# Patient Record
Sex: Male | Born: 1965 | Race: White | Hispanic: No | Marital: Married | Smoking: Never smoker
Health system: Southern US, Community
[De-identification: ages and names within clinical notes are randomized; demographics above are authoritative.]

## PROBLEM LIST (undated history)

## (undated) DIAGNOSIS — A048 Other specified bacterial intestinal infections: Secondary | ICD-10-CM

## (undated) DIAGNOSIS — I10 Essential (primary) hypertension: Secondary | ICD-10-CM

## (undated) DIAGNOSIS — M1990 Unspecified osteoarthritis, unspecified site: Secondary | ICD-10-CM

## (undated) DIAGNOSIS — C801 Malignant (primary) neoplasm, unspecified: Secondary | ICD-10-CM

## (undated) DIAGNOSIS — Z85528 Personal history of other malignant neoplasm of kidney: Secondary | ICD-10-CM

## (undated) DIAGNOSIS — M109 Gout, unspecified: Secondary | ICD-10-CM

## (undated) DIAGNOSIS — J029 Acute pharyngitis, unspecified: Secondary | ICD-10-CM

## (undated) DIAGNOSIS — E785 Hyperlipidemia, unspecified: Secondary | ICD-10-CM

## (undated) DIAGNOSIS — N200 Calculus of kidney: Secondary | ICD-10-CM

## (undated) DIAGNOSIS — L409 Psoriasis, unspecified: Secondary | ICD-10-CM

## (undated) HISTORY — DX: Personal history of other malignant neoplasm of kidney: Z85.528

## (undated) HISTORY — DX: Calculus of kidney: N20.0

## (undated) HISTORY — DX: Gout, unspecified: M10.9

## (undated) HISTORY — DX: Hyperlipidemia, unspecified: E78.5

## (undated) HISTORY — DX: Unspecified osteoarthritis, unspecified site: M19.90

## (undated) HISTORY — DX: Other specified bacterial intestinal infections: A04.8

## (undated) HISTORY — DX: Psoriasis, unspecified: L40.9

## (undated) HISTORY — DX: Malignant (primary) neoplasm, unspecified: C80.1

## (undated) HISTORY — PX: COLONOSCOPY: SHX174

## (undated) HISTORY — DX: Essential (primary) hypertension: I10

---

## 2004-10-25 DIAGNOSIS — I10 Essential (primary) hypertension: Secondary | ICD-10-CM

## 2004-10-25 HISTORY — DX: Essential (primary) hypertension: I10

## 2004-11-07 ENCOUNTER — Ambulatory Visit: Payer: Self-pay | Admitting: Family Medicine

## 2004-12-04 ENCOUNTER — Ambulatory Visit: Payer: Self-pay | Admitting: Family Medicine

## 2004-12-11 ENCOUNTER — Ambulatory Visit: Payer: Self-pay | Admitting: Family Medicine

## 2005-01-20 ENCOUNTER — Ambulatory Visit: Payer: Self-pay | Admitting: Family Medicine

## 2005-03-21 ENCOUNTER — Ambulatory Visit: Payer: Self-pay | Admitting: Family Medicine

## 2005-04-02 ENCOUNTER — Ambulatory Visit: Payer: Self-pay | Admitting: Gastroenterology

## 2005-04-18 ENCOUNTER — Ambulatory Visit: Payer: Self-pay | Admitting: Gastroenterology

## 2005-09-24 DIAGNOSIS — E785 Hyperlipidemia, unspecified: Secondary | ICD-10-CM

## 2005-09-24 HISTORY — DX: Hyperlipidemia, unspecified: E78.5

## 2005-10-24 ENCOUNTER — Ambulatory Visit: Payer: Self-pay | Admitting: Family Medicine

## 2005-11-03 ENCOUNTER — Ambulatory Visit: Payer: Self-pay | Admitting: Family Medicine

## 2006-05-12 ENCOUNTER — Encounter: Payer: Self-pay | Admitting: Family Medicine

## 2006-05-12 DIAGNOSIS — I10 Essential (primary) hypertension: Secondary | ICD-10-CM | POA: Insufficient documentation

## 2006-05-12 DIAGNOSIS — L408 Other psoriasis: Secondary | ICD-10-CM | POA: Insufficient documentation

## 2006-11-13 ENCOUNTER — Ambulatory Visit: Payer: Self-pay | Admitting: Family Medicine

## 2007-06-15 ENCOUNTER — Ambulatory Visit: Payer: Self-pay | Admitting: Family Medicine

## 2007-07-29 ENCOUNTER — Ambulatory Visit: Payer: Self-pay | Admitting: Family Medicine

## 2007-09-13 ENCOUNTER — Ambulatory Visit: Payer: Self-pay | Admitting: Family Medicine

## 2007-10-28 ENCOUNTER — Ambulatory Visit: Payer: Self-pay | Admitting: Family Medicine

## 2007-10-28 LAB — CONVERTED CEMR LAB
AST: 20 units/L (ref 0–37)
BUN: 18 mg/dL (ref 6–23)
Bilirubin, Direct: 0.1 mg/dL (ref 0.0–0.3)
CO2: 31 meq/L (ref 19–32)
Cholesterol: 140 mg/dL (ref 0–200)
Creatinine, Ser: 1.2 mg/dL (ref 0.4–1.5)
Direct LDL: 50.1 mg/dL
HDL: 21.2 mg/dL — ABNORMAL LOW (ref >39.0)
Sodium: 142 meq/L (ref 135–145)
Total Bilirubin: 0.9 mg/dL (ref 0.3–1.2)
Total CHOL/HDL Ratio: 6.6
Triglycerides: 533 mg/dL (ref 0–149)

## 2007-11-03 ENCOUNTER — Ambulatory Visit: Payer: Self-pay | Admitting: Family Medicine

## 2007-11-03 DIAGNOSIS — E785 Hyperlipidemia, unspecified: Secondary | ICD-10-CM | POA: Insufficient documentation

## 2008-07-07 ENCOUNTER — Telehealth: Payer: Self-pay | Admitting: Internal Medicine

## 2008-09-20 ENCOUNTER — Telehealth: Payer: Self-pay | Admitting: Family Medicine

## 2009-04-18 ENCOUNTER — Ambulatory Visit: Payer: Self-pay | Admitting: Family Medicine

## 2009-04-18 DIAGNOSIS — H53149 Visual discomfort, unspecified: Secondary | ICD-10-CM | POA: Insufficient documentation

## 2009-04-18 DIAGNOSIS — R0609 Other forms of dyspnea: Secondary | ICD-10-CM | POA: Insufficient documentation

## 2009-04-18 DIAGNOSIS — R0989 Other specified symptoms and signs involving the circulatory and respiratory systems: Secondary | ICD-10-CM

## 2009-05-10 ENCOUNTER — Encounter: Payer: Self-pay | Admitting: Family Medicine

## 2009-05-15 ENCOUNTER — Encounter: Payer: Self-pay | Admitting: Family Medicine

## 2009-05-18 ENCOUNTER — Encounter: Payer: Self-pay | Admitting: Family Medicine

## 2009-05-18 ENCOUNTER — Encounter: Admission: RE | Admit: 2009-05-18 | Discharge: 2009-05-18 | Payer: Self-pay | Admitting: Neurology

## 2009-05-18 HISTORY — PX: OTHER SURGICAL HISTORY: SHX169

## 2009-05-25 ENCOUNTER — Encounter: Admission: RE | Admit: 2009-05-25 | Discharge: 2009-05-25 | Payer: Self-pay | Admitting: Neurology

## 2009-06-15 ENCOUNTER — Encounter: Payer: Self-pay | Admitting: Family Medicine

## 2009-06-18 ENCOUNTER — Encounter: Payer: Self-pay | Admitting: Family Medicine

## 2009-06-18 DIAGNOSIS — G43909 Migraine, unspecified, not intractable, without status migrainosus: Secondary | ICD-10-CM | POA: Insufficient documentation

## 2009-07-24 ENCOUNTER — Encounter: Payer: Self-pay | Admitting: Family Medicine

## 2009-07-30 ENCOUNTER — Ambulatory Visit: Payer: Self-pay | Admitting: Family Medicine

## 2009-07-31 ENCOUNTER — Ambulatory Visit (HOSPITAL_COMMUNITY): Admission: RE | Admit: 2009-07-31 | Discharge: 2009-07-31 | Payer: Self-pay | Admitting: Neurology

## 2009-09-10 ENCOUNTER — Telehealth: Payer: Self-pay | Admitting: Family Medicine

## 2009-09-10 ENCOUNTER — Ambulatory Visit: Payer: Self-pay | Admitting: Family Medicine

## 2009-09-11 LAB — CONVERTED CEMR LAB: ALT: 15 units/L (ref 0–53)

## 2009-10-01 ENCOUNTER — Encounter (INDEPENDENT_AMBULATORY_CARE_PROVIDER_SITE_OTHER): Payer: Self-pay | Admitting: *Deleted

## 2009-10-30 ENCOUNTER — Encounter (INDEPENDENT_AMBULATORY_CARE_PROVIDER_SITE_OTHER): Payer: Self-pay | Admitting: *Deleted

## 2009-10-31 ENCOUNTER — Ambulatory Visit: Payer: Self-pay | Admitting: Family Medicine

## 2009-11-01 LAB — CONVERTED CEMR LAB
ALT: 14 units/L (ref 0–53)
AST: 20 units/L (ref 0–37)
Direct LDL: 83.3 mg/dL
HDL: 28.3 mg/dL — ABNORMAL LOW (ref >39.00)
Total CHOL/HDL Ratio: 6

## 2009-11-02 ENCOUNTER — Ambulatory Visit: Payer: Self-pay | Admitting: Family Medicine

## 2010-03-26 NOTE — Assessment & Plan Note (Signed)
Summary: discuss triglycerides   Vital Signs:  Patient profile:   45 year old male Weight:      204 pounds Temp:     98.1 degrees F oral Pulse rate:   72 / minute Pulse rhythm:   regular BP sitting:   118 / 80  (left arm) Cuff size:   regular  Vitals Entered By: Sydell Axon LPN (July 30, 1608 9:12 AM) CC: Discuss triglyerides   History of Present Illness: Pt here to discuss Triglycerides which are again found to be high. He was found to have only migraines after a scare on his Head MRI/MRA to have lesions concerning for MS...he was reassured he does not currently have MS. Overall he is doing well. His "lesions" could be atherosclerotic and he is agreeable to trmt.  Problems Prior to Update: 1)  Migraine Headache (DR LEWIT)  (ICD-346.90) 2)  Snoring  (ICD-786.09) 3)  Photophobia  (ICD-368.13) 4)  Health Maintenance Exam  (ICD-V70.0) 5)  Psoriasis  (ICD-696.1) 6)  Hypertension  (ICD-401.9) 7)  Hyperlipidemia (LOW HDL/HIGH TRIG)  (ICD-272.4)  Medications Prior to Update: 1)  Lisinopril 20 Mg  Tabs (Lisinopril) .... One Tab By Mouth Daily  Allergies: No Known Drug Allergies  Physical Exam  General:  Well-developed,well-nourished,in no acute distress; alert,appropriate and cooperative throughout examination Head:  Normocephalic and atraumatic without obvious abnormalities. No apparent alopecia or balding. Eyes:  Conjunctiva clear bilaterally.  Ears:  External ear exam shows no significant lesions or deformities.  Otoscopic examination reveals clear canals, tympanic membranes are intact bilaterally without bulging, retraction, inflammation or discharge. Hearing is grossly normal bilaterally. Nose:  External nasal examination shows no deformity or inflammation. Nasal mucosa are pink and moist without lesions or exudates. Mouth:  Oral mucosa and oropharynx without lesions or exudates.  Teeth in good repair.   Impression & Recommendations:  Problem # 1:  HYPERLIPIDEMIA (LOW  HDL/HIGH TRIG) (ICD-272.4) Assessment Deteriorated Start Niaspan and recheck per insructions. His updated medication list for this problem includes:    Niaspan 500 Mg Cr-tabs (Niacin (antihyperlipidemic)) ..... One tab by mouth at night    Niaspan 1000 Mg Cr-tabs (Niacin (antihyperlipidemic)) ..... One tab by mouth at night  Labs Reviewed: SGOT: 20 (10/28/2007)   SGPT: 19 (10/28/2007)   HDL:21.2 (10/28/2007)  LDL:DEL (10/28/2007)  Chol:140 (10/28/2007)  Trig:533 (10/28/2007)  Complete Medication List: 1)  Lisinopril 20 Mg Tabs (Lisinopril) .... One tab by mouth daily 2)  Niaspan 500 Mg Cr-tabs (Niacin (antihyperlipidemic)) .... One tab by mouth at night 3)  Niaspan 1000 Mg Cr-tabs (Niacin (antihyperlipidemic)) .... One tab by mouth at night 4)  Aspir-low 81 Mg Tbec (Aspirin) .... One tab by mouth daily  Patient Instructions: 1)  SGOT, SGPT 272.44    6 WEEKS 2)  See me in 3 mos, SGOT, SGPT, CHOL PROFILE  272.  4   prior  with Dr Para March Prescriptions: NIASPAN 1000 MG CR-TABS (NIACIN (ANTIHYPERLIPIDEMIC)) one tab by mouth at night  #30 x 12   Entered and Authorized by:   Shaune Leeks MD   Signed by:   Shaune Leeks MD on 07/30/2009   Method used:   [facility]        [facility]  [phone]* (retail)       [address] [place], [place]  [address]       Ph: [phone] or [phone]       Fax: [phone]   RxID:  [id number] NIASPAN 500 MG CR-TABS (NIACIN (ANTIHYPERLIPIDEMIC)) one tab by mouth at night  #30 x 0   Entered and Authorized by:   Shaune Leeks MD   Signed by:   Shaune Leeks MD on 07/30/2009   Method used:   [facility]        [facility]  [phone]* (retail)       [address] [place], [place]  [address]       Ph: [phone] or [phone]       Fax: [phone]   RxID:   [id number]   Current Allergies (reviewed today): No known allergies

## 2010-03-26 NOTE — Consult Note (Signed)
Summary: [facility]liot [facility]  [facility]li[facility],Note   Imported By: Beau Fanny 07/30/2009 14:00:04  _____________________________________________________________________  External Attachment:    Type:   Image     Comment:   External Document

## 2010-03-26 NOTE — Assessment & Plan Note (Signed)
Summary: MIGRAINE HA/CLE   Vital Signs:  Patient profile:   45 year old male Height:      73.5 inches Weight:      204 pounds BMI:     26.65 Temp:     98.2 degrees F oral Pulse rate:   56 / minute Pulse rhythm:   regular BP sitting:   130 / 86  (left arm) Cuff size:   regular  Vitals Entered By: Sydell Axon LPN (April 18, 2009 10:42 AM) CC: Migraine like symptoms, light flashes but not much headache and wants to discuss snoring options   History of Present Illness: Pt here for minimal headaches but sensitivity to light and light flashes without much headache. He saw an eye doctor and was told vision off but otherwise nml. He has noticed sensitivity to light and intense light. He then sees light flashes for the next 20 mins or more.  He also has snoring. He does not stop breathing. It is positional.  Problems Prior to Update: 1)  Health Maintenance Exam  (ICD-V70.0) 2)  Psoriasis  (ICD-696.1) 3)  Hypertension  (ICD-401.9) 4)  Hyperlipidemia (LOW HDL/HIGH TRIG)  (ICD-272.4)  Medications Prior to Update: 1)  Lisinopril 20 Mg  Tabs (Lisinopril) .... One Tab By Mouth Qd 2)  Valtrex 1 Gm Tabs (Valacyclovir Hcl) .... 2 Tabs By Mouth Two Times A Day For One Day.  Allergies: No Known Drug Allergies  Physical Exam  General:  Well-developed,well-nourished,in no acute distress; alert,appropriate and cooperative throughout examination Head:  Normocephalic and atraumatic without obvious abnormalities. No apparent alopecia or balding. Eyes:  Conjunctiva clear bilaterally.  Ears:  External ear exam shows no significant lesions or deformities.  Otoscopic examination reveals clear canals, tympanic membranes are intact bilaterally without bulging, retraction, inflammation or discharge. Hearing is grossly normal bilaterally. Nose:  External nasal examination shows no deformity or inflammation. Nasal mucosa are pink and moist without lesions or exudates. Mouth:  Oral mucosa and oropharynx  without lesions or exudates.  Teeth in good repair. Neurologic:  No cranial nerve deficits noted. Station and gait are normal.  Sensory, motor and coordinative functions appear intact.   Impression & Recommendations:  Problem # 1:  PHOTOPHOBIA (ICD-368.13) Assessment New Refer for migraine eval. Orders: Neurology Referral (Neuro)  Problem # 2:  SNORING (ICD-786.09) Assessment: New  Discussed poss use of new appliance seen on the market. His updated medication list for this problem includes:    Lisinopril 20 Mg Tabs (Lisinopril) ..... One tab by mouth daily  Complete Medication List: 1)  Lisinopril 20 Mg Tabs (Lisinopril) .... One tab by mouth daily  Patient Instructions: 1)  Refer for possible migraine eval.  Current Allergies (reviewed today): No known allergies

## 2010-03-26 NOTE — Progress Notes (Signed)
Summary: Rx request for Lisinopril  Phone Note Refill Request Call back at [phone] Message from:  Patient on September 10, 2009 12:33 PM  Refills Requested: Medication #1:  LISINOPRIL 20 MG  TABS one tab by mouth daily [facility] [place] [facility] [address]..Call back # [phone]   Method Requested: Electronic Initial call taken by: Daine Gip,  September 10, 2009 12:33 PM  Follow-up for Phone Call        refill sent electronically. Follow-up by: Delilah Shan CMA Duncan Dull),  September 10, 2009 2:34 PM    Prescriptions: LISINOPRIL 20 MG  TABS (LISINOPRIL) one tab by mouth daily  #30 x 12   Entered by:   Delilah Shan CMA (AAMA)   Authorized by:   Crawford Givens MD   Signed by:   Delilah Shan CMA (AAMA) on 09/10/2009   Method used:   [facility]        [facility]  [phone]* (retail)       [address] [place], [place]  [address]       Ph: [phone] or [phone]       Fax: [phone]   RxID:   [id number]

## 2010-03-26 NOTE — Assessment & Plan Note (Signed)
Summary: F/U AFTER LABS / LFW r/s 11/01/09   Vital Signs:  Patient profile:   45 year old male Height:      73.5 inches Weight:      202.75 pounds BMI:     26.48 Temp:     98 degrees F oral Pulse rate:   64 / minute Pulse rhythm:   regular BP sitting:   124 / 80  (left arm) Cuff size:   large  Vitals Entered By: Delilah Shan CMA Vivica Dobosz Dull) (November 02, 2009 11:50 AM) CC: 3 months follow up after labs   History of Present Illness: Hypertension:      Using medication without problems or lightheadedness: yes Chest pain with exertion:no Edema:no Short of breath:no Average home BPs: similar to today Other issues: no  Elevated Cholesterol: Using medications without problems:yes Muscle aches: no Other complaints: no flushing  Problems Prior to Update: 1)  Migraine Headache (DR LEWIT)  (ICD-346.90) 2)  Snoring  (ICD-786.09) 3)  Photophobia  (ICD-368.13) 4)  Health Maintenance Exam  (ICD-V70.0) 5)  Psoriasis  (ICD-696.1) 6)  Hypertension  (ICD-401.9) 7)  Hyperlipidemia (LOW HDL/HIGH TRIG)  (ICD-272.4)  Allergies: No Known Drug Allergies  Family History: Reviewed history from 11/03/2007 and no changes required. Father: Alive MI/ HTN Mother: ALIVE, HLD Siblings: 1 sister DECEASED 23/HYDROCEPHALOUS-SHUNT AS CHILD  CV +FATHER MI,PGF PGF DECEASED MI HBP+SELF DM NEGATIVE ETOH /ABUSE UNCLE  Social History: Reviewed history from 05/12/2006 and no changes required. 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  Otherwise negative.    Physical Exam  General:  GEN: nad, alert and oriented HEENT: mucous membranes moist NECK: supple w/o LA CV: rrr.  no murmur PULM: ctab, no inc wob ABD: soft, +bs EXT: no edema SKIN: no acute rash    Impression & Recommendations:  Problem # 1:   HYPERTENSION (ICD-401.9) >25 min spent with patient, at least half of which was spent on counseling re: HTN and HLD.  No change in meds.   labs reviewed with patient.   His updated medication list for this problem includes:    Lisinopril 20 Mg Tabs (Lisinopril) ..... One tab by mouth daily  Problem # 2:  HYPERLIPIDEMIA (LOW HDL/HIGH TRIG) (ICD-272.4) No change in meds other than adding fish oil.  Tolerating well.  D/w patient [id number].  The following medications were removed from the medication list:    Niaspan 500 Mg Cr-tabs (Niacin (antihyperlipidemic)) ..... One tab by mouth at night His updated medication list for this problem includes:    Niaspan 1000 Mg Cr-tabs (Niacin (antihyperlipidemic)) ..... One tab by mouth at night  Complete Medication List: 1)  Lisinopril 20 Mg Tabs (Lisinopril) .... One tab by mouth daily 2)  Niaspan 1000 Mg Cr-tabs (Niacin (antihyperlipidemic)) .... One tab by mouth at night 3)  Aspir-low 81 Mg Tbec (Aspirin) .... One tab by mouth daily  Patient Instructions: 1)  See if you can get a flu shot at work.  Try to adjust your diet and increase your amount of exercise.  I would take fish oil at night (1-2 tabs). 2)  I would get a physical next year.  Prescriptions: NIASPAN 1000 MG CR-TABS (NIACIN (ANTIHYPERLIPIDEMIC)) one tab by mouth at night  #90 x 3   Entered and Authorized by:   Crawford Givens  MD   Signed by:   Crawford Givens MD on 11/02/2009   Method used:   [facility]  [phone]* (retail)       [address] [place], [place]  [address]       Ph: [phone] or [phone]       Fax: [phone]   RxID:   [id number] LISINOPRIL 20 MG  TABS (LISINOPRIL) one tab by mouth daily  #90 x 3   Entered and Authorized by:   Crawford Givens MD   Signed by:   Crawford Givens MD on 11/02/2009   Method used:   [facility]        [facility]  [phone]* (retail)       [address] [place],  [place]  [address]       Ph: [phone] or [phone]       Fax: [phone]   RxID:   [id number]   Current Allergies (reviewed today): No known allergies

## 2010-03-26 NOTE — Letter (Signed)
Summary: [facility] / BRAIN MRI STUDY / DR. Woody Seller LEWIT   [facility] BRAIN MRI STUDY / DR. Woody Seller LEWIT   Imported By: Carin Primrose 06/21/2009 10:33:36  _____________________________________________________________________  External Attachment:    Type:   Image     Comment:   External Document  Appended Document: [facility] / BRAIN MRI STUDY / DR. Woody Seller LEWIT      Clinical Lists Changes  Problems: Added new problem of MIGRAINE HEADACHE (DR LEWIT) (ICD-346.90) Observations: Added new observation of PAST SURG HX: colonoscopy/polyp 1991 colonoscopy diverticulitis/ 04/18/2005 MRI Head Multip Nonspecific changes.     05/18/2009 MRA Head nad Neck  Nml  05/18/2009  (06/21/2009 17:53)       Past Surgical History:    colonoscopy/polyp 1991    colonoscopy diverticulitis/ 04/18/2005    MRI Head Multip Nonspecific changes.     05/18/2009    MRA Head nad Neck  Nml  05/18/2009

## 2010-03-26 NOTE — Letter (Signed)
Summary: [place] No Show Letter  [place] [facility]  [address] [place], [place] [address]   Phone: [phone]  Fax: [phone]    10/30/2009 MRN: [id number]  [facility] [address] [place], [place]  [address]   Dear Mr. Ruble,   Our records indicate that you missed your scheduled appointment with ____lab_________________ on _9.6.11___________.  Please contact this office to reschedule your appointment as soon as possible.  It is important that you keep your scheduled appointments with your physician, so we can provide you the best care possible.  Please be advised that there may be a charge for "no show" appointments.    Sincerely,   [place] [facility]

## 2010-03-26 NOTE — Letter (Signed)
Summary: Nadara Eaton letter  [place] [facility]  [address] [place], [place] [address]   Phone: [phone]  Fax: [phone]       10/01/2009 MRN: [id number]  [facility] [address] [place], [place]  [address]  Dear Mr. Ladell Heads [facility] [place] and [place] announce the retirement of Arta Silence, M.D., from full-time practice at the [facility] office effective August 23, 2009 and his plans of returning part-time.  It is important to Dr. Hetty Ely and to our practice that you understand that [facility] has seven physicians in our office for your health care needs.  We will continue to offer the same exceptional care that you have today.    Dr. Hetty Ely has spoken to many of you about his plans for retirement and returning part-time in the fall.   We will continue to work with you through the transition to schedule appointments for you in the office and meet the high standards that [facility] is committed to.   Again, it is with great pleasure that we share the news that Dr. Hetty Ely will return to [facility] in October of 2011 with a reduced schedule.    If you have any questions, or would like to request an appointment with one of our physicians, please call us at [phone] and press the option for Scheduling an appointment.  We take pleasure in providing you with excellent patient care and look forward to seeing you at your next office visit.  Our [facility] Physicians are:  Tillman Abide, M.D. Laurita Quint, M.D. Roxy Manns, M.D. Kerby Nora, M.D. Hannah Beat, M.D. Ruthe Mannan, M.D. We proudly welcomed Raechel Ache, M.D. and Eustaquio Boyden, M.D. to the practice in July/August 2011.  Sincerely,  [place] [facility]

## 2010-03-26 NOTE — Consult Note (Signed)
Summary: [facility]Eliot [facility]it,[facility],Note  Dr[facility]Eliot [facility],Note   Imported By: Beau Fanny 05/11/2009 16:31:58  _____________________________________________________________________  External Attachment:    Type:   Image     Comment:   External Document

## 2010-11-21 ENCOUNTER — Other Ambulatory Visit: Payer: Self-pay | Admitting: Family Medicine

## 2010-11-21 DIAGNOSIS — I10 Essential (primary) hypertension: Secondary | ICD-10-CM

## 2010-11-22 NOTE — Telephone Encounter (Signed)
Last OV 11/02/2009, please advise.

## 2010-11-24 NOTE — Telephone Encounter (Signed)
Schedule for CPE with labs ahead of time. Rx sent.  Thanks.

## 2010-11-25 NOTE — Telephone Encounter (Signed)
LMOVM at work number to schedule lab and CPE.

## 2011-04-09 ENCOUNTER — Other Ambulatory Visit: Payer: Self-pay | Admitting: *Deleted

## 2011-04-09 MED ORDER — LISINOPRIL 20 MG PO TABS: 20.0000 mg | ORAL_TABLET | Freq: Every day | ORAL | Status: DC

## 2011-04-09 NOTE — Telephone Encounter (Signed)
Patient called requesting a refill on his Lisinopril. Patient has not been seen in over a year. Patient has schedule an appointment in April. Is it okay to give patient enough medication to last until that appointment?

## 2011-04-09 NOTE — Telephone Encounter (Signed)
Patient notified that prescription has been sent to the pharmacy.

## 2011-04-09 NOTE — Telephone Encounter (Signed)
Yes, rx sent.

## 2011-05-19 ENCOUNTER — Other Ambulatory Visit: Payer: Self-pay | Admitting: Family Medicine

## 2011-05-20 NOTE — Telephone Encounter (Signed)
Patient has not been seen since 11/02/2009.

## 2011-05-20 NOTE — Telephone Encounter (Signed)
Sent.  Has 4/13 OV.

## 2011-06-02 ENCOUNTER — Other Ambulatory Visit (INDEPENDENT_AMBULATORY_CARE_PROVIDER_SITE_OTHER): Payer: Self-pay

## 2011-06-02 DIAGNOSIS — I10 Essential (primary) hypertension: Secondary | ICD-10-CM

## 2011-06-02 LAB — LIPID PANEL
HDL: 28.3 mg/dL — ABNORMAL LOW (ref >39.00)
Total CHOL/HDL Ratio: 5

## 2011-06-02 LAB — COMPREHENSIVE METABOLIC PANEL
AST: 21 U/L (ref 0–37)
BUN: 19 mg/dL (ref 6–23)
Calcium: 9.4 mg/dL (ref 8.4–10.5)
Chloride: 104 mEq/L (ref 96–112)
Creatinine, Ser: 1.2 mg/dL (ref 0.4–1.5)
GFR: 67.75 mL/min (ref >60.00)
Potassium: 4.1 mEq/L (ref 3.5–5.1)
Total Bilirubin: 0.5 mg/dL (ref 0.3–1.2)

## 2011-06-09 ENCOUNTER — Encounter: Payer: Self-pay | Admitting: Family Medicine

## 2011-06-10 ENCOUNTER — Encounter: Payer: Self-pay | Admitting: Family Medicine

## 2011-06-13 ENCOUNTER — Encounter: Payer: Self-pay | Admitting: Family Medicine

## 2011-06-13 ENCOUNTER — Ambulatory Visit (INDEPENDENT_AMBULATORY_CARE_PROVIDER_SITE_OTHER): Payer: Commercial Indemnity | Admitting: Family Medicine

## 2011-06-13 VITALS — BP 122/82 | HR 72 | Temp 98.3°F | Wt 197.0 lb

## 2011-06-13 DIAGNOSIS — E785 Hyperlipidemia, unspecified: Secondary | ICD-10-CM

## 2011-06-13 DIAGNOSIS — Z0000 Encounter for general adult medical examination without abnormal findings: Secondary | ICD-10-CM

## 2011-06-13 DIAGNOSIS — I10 Essential (primary) hypertension: Secondary | ICD-10-CM

## 2011-06-13 MED ORDER — NIACIN ER (ANTIHYPERLIPIDEMIC) 1000 MG PO TBCR: 1000.0000 mg | EXTENDED_RELEASE_TABLET | Freq: Every day | ORAL | Status: DC

## 2011-06-13 MED ORDER — LISINOPRIL 20 MG PO TABS: 20.0000 mg | ORAL_TABLET | Freq: Every day | ORAL | Status: DC

## 2011-06-13 NOTE — Progress Notes (Signed)
CPE- see plan.  No indication for PSA or colon cancer screening now.  D/w pt about flu shot. Up to date on td.  D/w pt about diet and exercise.   Hypertension:   Using medication without problems or lightheadedness: yes Chest pain with exertion:no Edema:no Short of breath:no  Elevated Cholesterol: Using medications without problems:yes, still with some occ flushing Muscle aches: no Diet compliance:yes Exercise:limited  Occ brief B testicle sensitivity (slightly more than he'd expect) with mild pressure, but no masses noted.  No dysuria.  Testicles usually not ttp.  Meds, vitals, and allergies reviewed.   PMH and SH reviewed  ROS: See HPI.  Otherwise negative.    GEN: nad, alert and oriented HEENT: mucous membranes moist NECK: supple w/o LA CV: rrr. PULM: ctab, no inc wob ABD: soft, +bs EXT: no edema SKIN: no acute rash Testes bilaterally descended without nodularity, tenderness or masses. No scrotal masses or lesions. No penis lesions or urethral discharge.

## 2011-06-13 NOTE — Patient Instructions (Signed)
Take care.  I would get a flu shot each fall.   Don't change your meds.  Increase your amount of exercise gradually.

## 2011-06-16 DIAGNOSIS — Z0000 Encounter for general adult medical examination without abnormal findings: Secondary | ICD-10-CM | POA: Insufficient documentation

## 2011-06-16 NOTE — Assessment & Plan Note (Addendum)
No indication for PSA or colon cancer screening now.  D/w pt about flu shot. Up to date on td.  D/w pt about diet and exercise.  Normal testicular exam, he'll notify me of changes.

## 2011-06-16 NOTE — Assessment & Plan Note (Signed)
Controlled. Continue current meds.  D/w pt about labs.

## 2011-06-16 NOTE — Assessment & Plan Note (Signed)
Not controlled. Continue current meds.  D/w pt about labs. Needs to work on exercise to try to optimize his numbers.

## 2012-02-01 ENCOUNTER — Emergency Department (HOSPITAL_COMMUNITY)
Admission: EM | Admit: 2012-02-01 | Discharge: 2012-02-01 | Disposition: A | Discharge status: Home / Self Care | Payer: Commercial Indemnity | Attending: Emergency Medicine | Admitting: Emergency Medicine

## 2012-02-01 ENCOUNTER — Emergency Department (HOSPITAL_COMMUNITY): Payer: Commercial Indemnity

## 2012-02-01 DIAGNOSIS — R112 Nausea with vomiting, unspecified: Secondary | ICD-10-CM | POA: Insufficient documentation

## 2012-02-01 DIAGNOSIS — Z862 Personal history of diseases of the blood and blood-forming organs and certain disorders involving the immune mechanism: Secondary | ICD-10-CM | POA: Insufficient documentation

## 2012-02-01 DIAGNOSIS — R7309 Other abnormal glucose: Secondary | ICD-10-CM | POA: Insufficient documentation

## 2012-02-01 DIAGNOSIS — Z8619 Personal history of other infectious and parasitic diseases: Secondary | ICD-10-CM | POA: Insufficient documentation

## 2012-02-01 DIAGNOSIS — R7303 Prediabetes: Secondary | ICD-10-CM

## 2012-02-01 DIAGNOSIS — I10 Essential (primary) hypertension: Secondary | ICD-10-CM | POA: Insufficient documentation

## 2012-02-01 DIAGNOSIS — Z79899 Other long term (current) drug therapy: Secondary | ICD-10-CM | POA: Insufficient documentation

## 2012-02-01 DIAGNOSIS — R1032 Left lower quadrant pain: Secondary | ICD-10-CM | POA: Insufficient documentation

## 2012-02-01 DIAGNOSIS — Z8679 Personal history of other diseases of the circulatory system: Secondary | ICD-10-CM | POA: Insufficient documentation

## 2012-02-01 DIAGNOSIS — Z7982 Long term (current) use of aspirin: Secondary | ICD-10-CM | POA: Insufficient documentation

## 2012-02-01 DIAGNOSIS — N201 Calculus of ureter: Secondary | ICD-10-CM

## 2012-02-01 LAB — URINALYSIS, ROUTINE W REFLEX MICROSCOPIC
Leukocytes, UA: NEGATIVE
Nitrite: NEGATIVE
Protein, ur: NEGATIVE mg/dL
Specific Gravity, Urine: 1.024 (ref 1.005–1.030)
pH: 6.5 (ref 5.0–8.0)

## 2012-02-01 LAB — COMPREHENSIVE METABOLIC PANEL
ALT: 12 U/L (ref 0–53)
Alkaline Phosphatase: 96 U/L (ref 39–117)
BUN: 25 mg/dL — ABNORMAL HIGH (ref 6–23)
CO2: 28 mEq/L (ref 19–32)
Chloride: 101 mEq/L (ref 96–112)
GFR calc Af Amer: 77 mL/min — ABNORMAL LOW (ref >90)
GFR calc non Af Amer: 66 mL/min — ABNORMAL LOW (ref >90)
Potassium: 4.2 mEq/L (ref 3.5–5.1)
Total Protein: 7.7 g/dL (ref 6.0–8.3)

## 2012-02-01 LAB — CBC WITH DIFFERENTIAL/PLATELET
Basophils Absolute: 0.1 10*3/uL (ref 0.0–0.1)
Eosinophils Relative: 1 % (ref 0–5)
Hemoglobin: 16.4 g/dL (ref 13.0–17.0)
Lymphocytes Relative: 17 % (ref 12–46)
MCH: 32.4 pg (ref 26.0–34.0)
Monocytes Absolute: 1.2 10*3/uL — ABNORMAL HIGH (ref 0.1–1.0)
Monocytes Relative: 8 % (ref 3–12)
Neutro Abs: 11 10*3/uL — ABNORMAL HIGH (ref 1.7–7.7)
Platelets: 231 10*3/uL (ref 150–400)
RBC: 5.06 MIL/uL (ref 4.22–5.81)
WBC: 14.9 10*3/uL — ABNORMAL HIGH (ref 4.0–10.5)

## 2012-02-01 LAB — URINE MICROSCOPIC-ADD ON

## 2012-02-01 MED ORDER — ONDANSETRON HCL 4 MG PO TABS: 4.0000 mg | ORAL_TABLET | Freq: Four times a day (QID) | ORAL | Status: DC

## 2012-02-01 MED ORDER — ONDANSETRON HCL 4 MG/2ML IJ SOLN
4.0000 mg | Freq: Once | INTRAMUSCULAR | Status: AC
  Administered 2012-02-01: 4 mg via INTRAVENOUS
  Filled 2012-02-01: qty 2

## 2012-02-01 MED ORDER — HYDROMORPHONE HCL PF 1 MG/ML IJ SOLN
0.5000 mg | Freq: Once | INTRAMUSCULAR | Status: AC
  Administered 2012-02-01: 0.5 mg via INTRAVENOUS
  Filled 2012-02-01: qty 1

## 2012-02-01 MED ORDER — HYDROMORPHONE HCL PF 1 MG/ML IJ SOLN
0.5000 mg | Freq: Once | INTRAMUSCULAR | Status: AC
  Administered 2012-02-01: 0.5 mg via INTRAVENOUS
  Filled 2012-02-01 (×2): qty 1

## 2012-02-01 MED ORDER — OXYCODONE-ACETAMINOPHEN 5-325 MG PO TABS: 1.0000 | ORAL_TABLET | Freq: Four times a day (QID) | ORAL | Status: DC | PRN

## 2012-02-01 NOTE — ED Provider Notes (Signed)
History     CSN: [id number]  Arrival date & time 02/01/12  0865   First MD Initiated Contact with Patient 02/01/12 0750      Chief Complaint  Patient presents with  . Flank Pain    (Consider location/radiation/quality/duration/timing/severity/associated sxs/prior treatment) HPI Patient presents to the emergency department with acute onset at 3:30 this morning of left flank pain that radiates around towards his left lower quadrant. He says that it woke him up from his sleep. Is not having any fevers, chills, nausea, vomiting, diarrhea. He has a past medical history which is positive for diverticulosis. Has never had kidney stones in the past. Denies radiation towards his testicles. He denies any hematuria or dysuria. nad vss   Past Medical History  Diagnosis Date  . Hyperlipemia 08/07  . Hypertension 09/06  . Psoriasis   . Migraine     Past Surgical History  Procedure Date  . Mri head 05/18/09    Multip nonspecific changes  . Mra head and neck 05/18/09    Nml    Family History  Problem Relation Age of Onset  . Hyperlipidemia Mother   . Heart disease Father     MI  . Hypertension Father   . Heart disease Paternal Grandfather     MI  . Diabetes Neg Hx   . Prostate cancer Neg Hx   . Alcohol abuse Other     History  Substance Use Topics  . Smoking status: Never Smoker   . Smokeless tobacco: Not on file  . Alcohol Use: Yes     Comment: < 5 week, sometimes none      Review of Systems  Review of Systems  Gen: no weight loss, fevers, chills, night sweats  Neck: no neck pain  Lungs:No wheezing, coughing or hemoptysis CV: no chest pain, palpitations, dependent edema or orthopnea  Abd: + flank pain, nausea, vomiting  GU: no dysuria or gross hematuria  MSK:  No abnormalities  Neuro: no headache, no focal neurologic deficits  Skin: no abnormalities Psyche: negative.   Allergies  Review of patient's allergies indicates no known allergies.  Home Medications    Current Outpatient Rx  Name  Route  Sig  Dispense  Refill  . ASPIRIN 81 MG PO TABS   Oral   Take 81 mg by mouth daily.         Marland Kitchen LISINOPRIL 20 MG PO TABS   Oral   Take 1 tablet (20 mg total) by mouth daily.   90 tablet   3   . ONE-DAILY MULTI VITAMINS PO TABS   Oral   Take 1 tablet by mouth daily.         Marland Kitchen NIACIN ER (ANTIHYPERLIPIDEMIC) 1000 MG PO TBCR   Oral   Take 1 tablet (1,000 mg total) by mouth at bedtime.   90 tablet   3   . ONDANSETRON HCL 4 MG PO TABS   Oral   Take 1 tablet (4 mg total) by mouth every 6 (six) hours.   12 tablet   0   . OXYCODONE-ACETAMINOPHEN 5-325 MG PO TABS   Oral   Take 1 tablet by mouth every 6 (six) hours as needed for pain.   15 tablet   0     BP 136/82  Pulse 64  Temp 97.5 F (36.4 C) (Oral)  Resp 16  SpO2 100%  Physical Exam  Nursing note and vitals reviewed. Constitutional: He appears well-developed and well-nourished. No distress.  Pt unable to sit still because of pain  HENT:  Head: Normocephalic and atraumatic.  Eyes: Pupils are equal, round, and reactive to light.  Neck: Normal range of motion. Neck supple.  Cardiovascular: Normal rate and regular rhythm.   Pulmonary/Chest: Effort normal.  Abdominal: He exhibits no distension and no mass. There is tenderness in the left upper quadrant and left lower quadrant. There is CVA tenderness. There is no rigidity, no rebound, no guarding, no tenderness at McBurney's point and negative Murphy's sign.  Neurological: He is alert.  Skin: Skin is warm and dry.    ED Course  Procedures (including critical care time)  Labs Reviewed  URINALYSIS, ROUTINE W REFLEX MICROSCOPIC - Abnormal; Notable for the following:    Hgb urine dipstick MODERATE (*)     All other components within normal limits  COMPREHENSIVE METABOLIC PANEL - Abnormal; Notable for the following:    Glucose, Bld 154 (*)     BUN 25 (*)     Total Bilirubin 0.2 (*)     GFR calc non Af Amer 66 (*)      GFR calc Af Amer 77 (*)     All other components within normal limits  CBC WITH DIFFERENTIAL - Abnormal; Notable for the following:    WBC 14.9 (*)     Neutro Abs 11.0 (*)     Monocytes Absolute 1.2 (*)     All other components within normal limits  URINE MICROSCOPIC-ADD ON  HEMOGLOBIN A1C   Ct Abdomen Pelvis Wo Contrast  02/01/2012  *RADIOLOGY REPORT*  Clinical Data: Left flank pain, nausea  CT ABDOMEN AND PELVIS WITHOUT CONTRAST  Technique:  Multidetector CT imaging of the abdomen and pelvis was performed following the standard protocol without intravenous contrast.  Comparison: None.  Findings: Lung bases are unremarkable.  Sagittal images of the spine shows degenerative changes with anterior spurring upper endplate of L3, L4 and L5 vertebral body.  Schmorl's node deformity noted upper endplate of L3.  There is disc space flattening with vacuum disc phenomenon at L5 S1 level.  The unenhanced liver shows no biliary ductal dilatation.  No calcified gallstones are noted within gallbladder.  Unenhanced spleen, pancreas and adrenal glands are unremarkable.  There is mild left hydronephrosis and proximal left hydroureter.  Mild left perinephric stranding.  There is a probable cyst in the mid pole of the left kidney measures 1.8 cm.  A calcified obstructive calculus is noted in proximal left ureter at the level of the upper endplate of the L3 vertebral body measures 3 mm.  Nonobstructive calcified calculus in mid pole of the right kidney measures 2 mm.  In axial image 39 and coronal image 51 there is a high density cortical exophytic lesion in the mid pole anterior aspect of the right kidney measures 1 cm.  This cannot be characterized without IV contrast.  Further evaluation with non emergent enhanced CT or MRI is recommended.  No aortic aneurysm.  No small bowel obstruction.  Bilateral distal ureter is unremarkable.  Prostate gland and seminal vesicles are unremarkable.  The urinary bladder is unremarkable.   No calcified calculi are noted within urinary bladder.  There is no pericecal inflammation.  The terminal ileum is unremarkable.  Normal appendix is partially visualized in coronal image 47.  IMPRESSION:  1.  There is mild left hydronephrosis and proximal left hydroureter. 2.  Mild left perinephric stranding.  There is 3 mm calcified obstructive calculus in proximal left ureter at the level of  the upper endplate of the L3 vertebral body. 3.  There is  right nonobstructive nephrolithiasis.In axial image 39 and coronal image 51 there is a high density cortical exophytic lesion in the mid pole anterior aspect of the right kidney measures 1 cm.  This cannot be characterized without IV contrast.  Further evaluation with non emergent enhanced CT or MRI is recommended. 4.  Unremarkable urinary bladder.  Unremarkable distal ureter bilaterally.   Original Report Authenticated By: Natasha Mead, M.D.      1. Prediabetes   2. Ureteral stone       MDM  Pt given 1 mg IV Dilaudid and 4mg  IV Zofran. He had blood in his urine therefore CT abd/pelv wo contrast ordered. It shows 3mm left proximal ureter stone. His cbg also 156. He also has a cyst that is 1.8 on his right kidney. He will follow-up with his PCP Dr. Para March regarding these issues. The stone is small and should not have any difficulty passing stone. Urine strainer given.  Zofran and Perc Rx given  Pt has been advised of the symptoms that warrant their return to the ED. Patient has voiced understanding and has agreed to follow-up with the PCP or specialist.         Dorthula Matas, PA 02/01/12 1129

## 2012-02-01 NOTE — ED Notes (Signed)
Pt states that the pain is now tolerable.  No distress noted.  Pt appears more comfortable.  UA sent.

## 2012-02-01 NOTE — ED Notes (Signed)
Patient transported to CT 

## 2012-02-01 NOTE — ED Notes (Signed)
Pt states that at 0200 this am he began to have severe L flank pain that radiates down to his LLQ.  He states that the pain did wake him from his sleep.  He does c/o of nausea.  No distress noted.  Denies hx of kidney stones.

## 2012-02-01 NOTE — ED Notes (Signed)
Pt given urine strainer for home use.  Pt voices understanding.

## 2012-02-01 NOTE — ED Notes (Signed)
Pt refused 2nd dose of Dilaudid.  Pt states that he is comfortable.

## 2012-02-02 ENCOUNTER — Ambulatory Visit (INDEPENDENT_AMBULATORY_CARE_PROVIDER_SITE_OTHER): Payer: Commercial Indemnity | Admitting: Family Medicine

## 2012-02-02 ENCOUNTER — Encounter: Payer: Self-pay | Admitting: Family Medicine

## 2012-02-02 VITALS — BP 122/82 | HR 93 | Temp 98.5°F | Wt 202.0 lb

## 2012-02-02 DIAGNOSIS — R739 Hyperglycemia, unspecified: Secondary | ICD-10-CM

## 2012-02-02 DIAGNOSIS — R7309 Other abnormal glucose: Secondary | ICD-10-CM

## 2012-02-02 DIAGNOSIS — N289 Disorder of kidney and ureter, unspecified: Secondary | ICD-10-CM

## 2012-02-02 DIAGNOSIS — N200 Calculus of kidney: Secondary | ICD-10-CM

## 2012-02-02 LAB — HEMOGLOBIN A1C: Hgb A1c MFr Bld: 5 % (ref <5.7)

## 2012-02-02 MED ORDER — OXYCODONE-ACETAMINOPHEN 5-325 MG PO TABS: 1.0000 | ORAL_TABLET | ORAL | Status: DC | PRN

## 2012-02-02 NOTE — Patient Instructions (Addendum)
Drop off the stone when you catch it.  We'll set up your repeat imaging about 1 month.   Take care.

## 2012-02-03 ENCOUNTER — Encounter: Payer: Self-pay | Admitting: Family Medicine

## 2012-02-03 DIAGNOSIS — Z85528 Personal history of other malignant neoplasm of kidney: Secondary | ICD-10-CM | POA: Insufficient documentation

## 2012-02-03 DIAGNOSIS — R739 Hyperglycemia, unspecified: Secondary | ICD-10-CM | POA: Insufficient documentation

## 2012-02-03 DIAGNOSIS — N200 Calculus of kidney: Secondary | ICD-10-CM | POA: Insufficient documentation

## 2012-02-03 DIAGNOSIS — C649 Malignant neoplasm of unspecified kidney, except renal pelvis: Secondary | ICD-10-CM | POA: Insufficient documentation

## 2012-02-03 NOTE — Progress Notes (Signed)
Seen in ER with renal stone and renal abnormality on imaging.  Still with intermittent L flank pain, some better with oxycodone.  No fevers, no R sided abd pain.  Hasn't passed stone yet. He has a Technical brewer.   D/w pt about imaging results and hyperglycemia.  Needs f/u imaging and he'll need to work on sugar/diet/weight.  Is cutting out soda. A1c was normal.   Meds, vitals, and allergies reviewed.   ROS: See HPI.  Otherwise, noncontributory.  nad ncat Mmm rrr ctab abd soft, normal BS Ext well perfused.

## 2012-02-03 NOTE — Assessment & Plan Note (Signed)
He's working on diet.  A1c was normal.  D/w pt.

## 2012-02-03 NOTE — ED Provider Notes (Signed)
Medical screening examination/treatment/procedure(s) were performed by non-physician practitioner and as supervising physician I was immediately available for consultation/collaboration.  Madison Direnzo T Ranata Laughery, MD 02/03/12 1642 

## 2012-02-03 NOTE — Assessment & Plan Note (Signed)
Given another rx for oxycodone.  Okay for outpatient f/u.  Note for work given. He'll collect the stone and bring in for analysis.  He agrees with plan.

## 2012-02-03 NOTE — Assessment & Plan Note (Signed)
2013-high density cortical exophytic lesion in the mid pole anterior aspect of the right kidney measures 1 cm.  Will set up CT with contrast.  D/w pt.

## 2012-02-09 ENCOUNTER — Other Ambulatory Visit: Payer: Self-pay | Admitting: Family Medicine

## 2012-02-09 ENCOUNTER — Other Ambulatory Visit (INDEPENDENT_AMBULATORY_CARE_PROVIDER_SITE_OTHER): Payer: Commercial Indemnity

## 2012-02-09 DIAGNOSIS — N200 Calculus of kidney: Secondary | ICD-10-CM

## 2012-03-03 ENCOUNTER — Inpatient Hospital Stay: Admission: RE | Admit: 2012-03-03 | Payer: Commercial Indemnity | Source: Ambulatory Visit

## 2012-03-04 ENCOUNTER — Other Ambulatory Visit: Payer: Self-pay | Admitting: Family Medicine

## 2012-03-04 ENCOUNTER — Ambulatory Visit (INDEPENDENT_AMBULATORY_CARE_PROVIDER_SITE_OTHER)
Admission: RE | Admit: 2012-03-04 | Discharge: 2012-03-04 | Disposition: A | Discharge status: Home / Self Care | Payer: Commercial Indemnity | Source: Ambulatory Visit | Attending: Family Medicine | Admitting: Family Medicine

## 2012-03-04 DIAGNOSIS — N289 Disorder of kidney and ureter, unspecified: Secondary | ICD-10-CM

## 2012-03-04 MED ORDER — IOHEXOL 300 MG/ML  SOLN
100.0000 mL | Freq: Once | INTRAMUSCULAR | Status: AC | PRN
  Administered 2012-03-04: 100 mL via INTRAVENOUS

## 2012-05-12 ENCOUNTER — Encounter: Payer: Self-pay | Admitting: Family Medicine

## 2012-05-12 ENCOUNTER — Ambulatory Visit (INDEPENDENT_AMBULATORY_CARE_PROVIDER_SITE_OTHER): Payer: Commercial Indemnity | Admitting: Family Medicine

## 2012-05-12 VITALS — BP 124/82 | HR 68 | Temp 98.4°F

## 2012-05-12 DIAGNOSIS — M25552 Pain in left hip: Secondary | ICD-10-CM

## 2012-05-12 DIAGNOSIS — M25559 Pain in unspecified hip: Secondary | ICD-10-CM

## 2012-05-12 DIAGNOSIS — T753XXA Motion sickness, initial encounter: Secondary | ICD-10-CM | POA: Insufficient documentation

## 2012-05-12 MED ORDER — SCOPOLAMINE 1 MG/3DAYS TD PT72: 1.0000 | MEDICATED_PATCH | TRANSDERMAL | Status: DC

## 2012-05-12 NOTE — Assessment & Plan Note (Signed)
Likely from strained hip flexor and not from the hip joint itself.  Anatomy d/w pt.  Would stretch quad and hamstring and this should resolve.  No need to image now.

## 2012-05-12 NOTE — Progress Notes (Signed)
Going on a day trip fishing trip soon and would like to try scopolamine patch.  Discussed.  No contraindication.    L hip pain.  "I know I'm getting older but I want it checked."  Off and on L hip pain.  H/o psoriasis but known prev joint involvement.  Pain trying to get his L shoe on/off, ie when he flexes the L hip.  Playing basketball doesn't seem to make it worse.  It fluctuated over the last few months.  Noted at least some over the last few weeks.  No injury known.    Meds, vitals, and allergies reviewed.   ROS: See HPI.  Otherwise, noncontributory.  nad ncat L hip with normal ROM but mild pain with testing L hip flexors.  ITB not ttp on testing, L troch bursa not ttp No pain with int/ext rotation Distally NV intact

## 2012-05-12 NOTE — Assessment & Plan Note (Signed)
H/o, can try scop patch with routine cautions given.  He understood.

## 2012-05-12 NOTE — Patient Instructions (Addendum)
Keep stretching your hip/quad/hamstring and this should gradually improve.   Use the patch if needed for motion sickness; you can use dramamine too if needed.  Take care.

## 2012-09-02 ENCOUNTER — Ambulatory Visit
Admission: RE | Admit: 2012-09-02 | Discharge: 2012-09-02 | Disposition: A | Discharge status: Home / Self Care | Payer: Commercial Indemnity | Source: Ambulatory Visit | Attending: Family Medicine | Admitting: Family Medicine

## 2012-09-02 DIAGNOSIS — N289 Disorder of kidney and ureter, unspecified: Secondary | ICD-10-CM

## 2012-09-02 MED ORDER — GADOBENATE DIMEGLUMINE 529 MG/ML IV SOLN
19.0000 mL | Freq: Once | INTRAVENOUS | Status: AC | PRN
  Administered 2012-09-02: 19 mL via INTRAVENOUS

## 2012-09-03 ENCOUNTER — Telehealth: Payer: Self-pay | Admitting: Family Medicine

## 2012-09-03 ENCOUNTER — Telehealth: Payer: Self-pay

## 2012-09-03 DIAGNOSIS — D49519 Neoplasm of unspecified behavior of unspecified kidney: Secondary | ICD-10-CM

## 2012-09-03 NOTE — Telephone Encounter (Signed)
Called pt about likely renal tumor.  Uncertain dx, possible RCC. Advised uro eval. Will refer.  He agrees.  App help from urology.

## 2012-09-03 NOTE — Telephone Encounter (Signed)
Erskine Squibb with [facility] called report for MRI abd done on 09/02/12. Report in Epic. Dr Para March not available given to Dr.Gutierrez.

## 2012-09-03 NOTE — Telephone Encounter (Signed)
See subsequent note from Dr. Algis Downs.  This pt has been called and referral to uro initiated.

## 2012-09-09 ENCOUNTER — Ambulatory Visit (HOSPITAL_COMMUNITY)
Admission: RE | Admit: 2012-09-09 | Discharge: 2012-09-09 | Disposition: A | Discharge status: Home / Self Care | Payer: Commercial Indemnity | Source: Ambulatory Visit | Attending: Urology | Admitting: Urology

## 2012-09-09 ENCOUNTER — Other Ambulatory Visit (HOSPITAL_COMMUNITY): Payer: Self-pay | Admitting: Urology

## 2012-09-09 DIAGNOSIS — D4959 Neoplasm of unspecified behavior of other genitourinary organ: Secondary | ICD-10-CM

## 2012-09-09 DIAGNOSIS — N289 Disorder of kidney and ureter, unspecified: Secondary | ICD-10-CM | POA: Insufficient documentation

## 2012-09-12 ENCOUNTER — Encounter: Payer: Self-pay | Admitting: Family Medicine

## 2012-09-13 ENCOUNTER — Other Ambulatory Visit: Payer: Self-pay | Admitting: Urology

## 2012-10-08 ENCOUNTER — Encounter (HOSPITAL_COMMUNITY): Payer: Self-pay | Admitting: Pharmacy Technician

## 2012-10-12 ENCOUNTER — Other Ambulatory Visit (HOSPITAL_COMMUNITY): Payer: Self-pay | Admitting: Urology

## 2012-10-12 NOTE — Patient Instructions (Addendum)
[name]  10/12/2012   Your procedure is scheduled on: 10/18/12  MONDAY   Report to [facility] at    0515   AM.  Call this number if you have problems the morning of surgery: [phone]     BOWEL PREP AS PER OFFICE  Remember:   Do not TAKE ANYTHING BY MOUTH AFTER MIDNIGHT Sunday NIGHT:After Midnight.   Take these medicines the morning of surgery with A SIP OF WATER: NONE   .  Contacts, dentures or partial plates can not be worn to surgery  Leave suitcase in the car. After surgery it may be brought to your room.  For patients admitted to the hospital, checkout time is 11:00 AM day of  discharge.             SPECIAL INSTRUCTIONS- SEE [place] PREPARING FOR SURGERY INSTRUCTION SHEET-     DO NOT WEAR JEWELRY, LOTIONS, POWDERS, OR PERFUMES.  WOMEN-- DO NOT SHAVE LEGS OR UNDERARMS FOR 12 HOURS BEFORE SHOWERS. MEN MAY SHAVE FACE.  Patients discharged the day of surgery will not be allowed to drive home. IF going home the day of surgery, you must have a driver and someone to stay with you for the first 24 hours  Name and phone number of your driver:     ADMISSION                                                                   Please read over the following fact sheets that you were given: , Incentive Spirometry Sheet, Blood Transfusion Sheet  Information                                                                                   [name]  PST [phone]  [phone]                 FAILURE TO FOLLOW THESE INSTRUCTIONS MAY RESULT IN  CANCELLATION   OF YOUR SURGERY                                                  Patient Signature _____________________________

## 2012-10-12 NOTE — Progress Notes (Signed)
Chest x ray  7/14  EPIC

## 2012-10-13 ENCOUNTER — Other Ambulatory Visit: Payer: Self-pay | Admitting: Family Medicine

## 2012-10-13 ENCOUNTER — Encounter (HOSPITAL_COMMUNITY)
Admission: RE | Admit: 2012-10-13 | Discharge: 2012-10-13 | Disposition: A | Discharge status: Home / Self Care | Payer: Commercial Indemnity | Source: Ambulatory Visit | Attending: Urology | Admitting: Urology

## 2012-10-13 ENCOUNTER — Encounter (HOSPITAL_COMMUNITY): Payer: Self-pay

## 2012-10-13 DIAGNOSIS — Z01812 Encounter for preprocedural laboratory examination: Secondary | ICD-10-CM | POA: Insufficient documentation

## 2012-10-13 DIAGNOSIS — I498 Other specified cardiac arrhythmias: Secondary | ICD-10-CM | POA: Insufficient documentation

## 2012-10-13 DIAGNOSIS — Z01810 Encounter for preprocedural cardiovascular examination: Secondary | ICD-10-CM | POA: Insufficient documentation

## 2012-10-13 HISTORY — DX: Acute pharyngitis, unspecified: J02.9

## 2012-10-13 LAB — BASIC METABOLIC PANEL
BUN: 19 mg/dL (ref 6–23)
CO2: 31 mEq/L (ref 19–32)
Chloride: 102 mEq/L (ref 96–112)
GFR calc non Af Amer: 74 mL/min — ABNORMAL LOW (ref >90)
Glucose, Bld: 97 mg/dL (ref 70–99)
Potassium: 4.5 mEq/L (ref 3.5–5.1)
Sodium: 138 mEq/L (ref 135–145)

## 2012-10-13 LAB — CBC
HCT: 47.2 % (ref 39.0–52.0)
Hemoglobin: 16.6 g/dL (ref 13.0–17.0)
MCH: 31.7 pg (ref 26.0–34.0)
MCHC: 35.2 g/dL (ref 30.0–36.0)
RBC: 5.24 MIL/uL (ref 4.22–5.81)

## 2012-10-13 LAB — ABO/RH: ABO/RH(D): O POS

## 2012-10-13 NOTE — Progress Notes (Signed)
States has a sore throat that began yesterday- sore on left side- denies fever.  INSTRUCTED MAY NEED TO BE EVALUATED BY pcp this week.  Also instructed to begin taking the Lisinopril as normal- hasnt been taking x few days.  Verbalized understanding

## 2012-10-15 NOTE — H&P (Signed)
  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  He initially developed left flank pain last year and was found to have a ureteral stone which he was able to spontaneously pass.  This was noted on an unenhanced CT scan.  As part of that scan, there was a very small exophytic lesion seen on the right kidney and this was reimaged with a CT scan with contrast.  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  Incidentally, he was noted to have an endophytic lesion in the upper pole of the right kidney that did appear to enhance and measured approximately 1.3 cm.  This lesion did raise concern for renal cell carcinoma.  In retrospect, this lesion was not very well seen on either of his CT scans previously.  There is no family history of kidney cancer and no family history of end-stage renal disease.  He denies any hematuria, abdominal pain, or weight loss.   Past Medical History Problems  1. History of  Hypercholesterolemia 272.0 2. History of  Hypertension 401.9 3. History of  Nephrolithiasis V13.01  Surgical History Problems  1. History of  No Surgical Problems  Current Meds 1. Aspirin 81 MG Oral Tablet; Therapy: (Recorded:17Jul2014) to 2. Lisinopril TABS; Therapy: (Recorded:15Jul2014) to 3. Niaspan TBCR; Therapy: (Recorded:15Jul2014) to  Allergies Medication  1. No Known Drug Allergies  Family History Problems  1. 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  Cardiovascular: no chest pain.  Respiratory: no shortness of breath.    Vitals  BMI Calculated: 25.67 BSA Calculated: 2.17 Height: 6 ft 2 in Weight: 200 lb    Physical Exam Constitutional: Well nourished and well developed . No acute distress.  ENT:. The ears and nose are normal in appearance.  Neck: The appearance of the neck is normal and no neck mass is present.  Pulmonary: No respiratory distress, normal respiratory rhythm and effort and clear bilateral breath sounds.  Cardiovascular: Heart rate and rhythm are normal . No peripheral edema.  Abdomen: The abdomen is soft and nontender. No masses are palpated. No CVA tenderness. No hernias are palpable. No hepatosplenomegaly noted.  Lymphatics: The supraclavicular, femoral and inguinal nodes are not enlarged or tender.  Skin: Normal skin turgor, no visible rash and no visible skin lesions.  Neuro/Psych:. Mood and affect are appropriate.    Assessment Assessed  1. Renal Neoplasm 239.5    Discussion/Summary  1.  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

## 2012-10-18 ENCOUNTER — Inpatient Hospital Stay (HOSPITAL_COMMUNITY): Payer: Commercial Indemnity | Admitting: Anesthesiology

## 2012-10-18 ENCOUNTER — Encounter (HOSPITAL_COMMUNITY): Payer: Self-pay | Admitting: *Deleted

## 2012-10-18 ENCOUNTER — Inpatient Hospital Stay (HOSPITAL_COMMUNITY)
Admission: RE | Admit: 2012-10-18 | Discharge: 2012-10-20 | DRG: 657 | Disposition: A | Discharge status: Home / Self Care | Payer: Commercial Indemnity | Source: Ambulatory Visit | Attending: Urology | Admitting: Urology

## 2012-10-18 ENCOUNTER — Encounter (HOSPITAL_COMMUNITY)
Admission: RE | Disposition: A | Discharge status: Home / Self Care | Payer: Self-pay | Source: Ambulatory Visit | Attending: Urology

## 2012-10-18 ENCOUNTER — Encounter (HOSPITAL_COMMUNITY): Payer: Self-pay | Admitting: Anesthesiology

## 2012-10-18 DIAGNOSIS — Z79899 Other long term (current) drug therapy: Secondary | ICD-10-CM

## 2012-10-18 DIAGNOSIS — I10 Essential (primary) hypertension: Secondary | ICD-10-CM | POA: Diagnosis present

## 2012-10-18 DIAGNOSIS — Z8249 Family history of ischemic heart disease and other diseases of the circulatory system: Secondary | ICD-10-CM

## 2012-10-18 DIAGNOSIS — E7800 Pure hypercholesterolemia, unspecified: Secondary | ICD-10-CM | POA: Diagnosis present

## 2012-10-18 DIAGNOSIS — C649 Malignant neoplasm of unspecified kidney, except renal pelvis: Principal | ICD-10-CM | POA: Diagnosis present

## 2012-10-18 DIAGNOSIS — Z7982 Long term (current) use of aspirin: Secondary | ICD-10-CM

## 2012-10-18 DIAGNOSIS — Z87442 Personal history of urinary calculi: Secondary | ICD-10-CM

## 2012-10-18 DIAGNOSIS — Z87891 Personal history of nicotine dependence: Secondary | ICD-10-CM

## 2012-10-18 DIAGNOSIS — Q619 Cystic kidney disease, unspecified: Secondary | ICD-10-CM

## 2012-10-18 HISTORY — PX: ROBOTIC ASSITED PARTIAL NEPHRECTOMY: SHX6087

## 2012-10-18 LAB — BASIC METABOLIC PANEL
CO2: 27 mEq/L (ref 19–32)
Chloride: 101 mEq/L (ref 96–112)
Glucose, Bld: 132 mg/dL — ABNORMAL HIGH (ref 70–99)
Sodium: 136 mEq/L (ref 135–145)

## 2012-10-18 LAB — TYPE AND SCREEN: ABO/RH(D): O POS

## 2012-10-18 LAB — HEMOGLOBIN AND HEMATOCRIT, BLOOD
HCT: 44.4 % (ref 39.0–52.0)
Hemoglobin: 15.7 g/dL (ref 13.0–17.0)

## 2012-10-18 SURGERY — ROBOTIC ASSITED PARTIAL NEPHRECTOMY
Anesthesia: General | Laterality: Right | Wound class: Clean

## 2012-10-18 MED ORDER — ONDANSETRON HCL 4 MG/2ML IJ SOLN
4.0000 mg | INTRAMUSCULAR | Status: DC | PRN
  Administered 2012-10-18 – 2012-10-19 (×3): 4 mg via INTRAVENOUS
  Filled 2012-10-18 (×3): qty 2

## 2012-10-18 MED ORDER — OXYCODONE HCL 5 MG PO TABS: 5.0000 mg | ORAL_TABLET | Freq: Once | ORAL | Status: DC | PRN

## 2012-10-18 MED ORDER — MANNITOL 25 % IV SOLN
25.0000 g | Freq: Once | INTRAVENOUS | Status: DC
  Filled 2012-10-18: qty 100

## 2012-10-18 MED ORDER — LIDOCAINE HCL (CARDIAC) 20 MG/ML IV SOLN
INTRAVENOUS | Status: DC | PRN
  Administered 2012-10-18: 100 mg via INTRAVENOUS

## 2012-10-18 MED ORDER — MIDAZOLAM HCL 5 MG/5ML IJ SOLN
INTRAMUSCULAR | Status: DC | PRN
  Administered 2012-10-18: 2 mg via INTRAVENOUS

## 2012-10-18 MED ORDER — NEOSTIGMINE METHYLSULFATE 1 MG/ML IJ SOLN
INTRAMUSCULAR | Status: DC | PRN
  Administered 2012-10-18: 5 mg via INTRAVENOUS

## 2012-10-18 MED ORDER — HYDROMORPHONE HCL PF 1 MG/ML IJ SOLN: INTRAMUSCULAR | Status: DC | PRN

## 2012-10-18 MED ORDER — DOCUSATE SODIUM 100 MG PO CAPS
100.0000 mg | ORAL_CAPSULE | Freq: Two times a day (BID) | ORAL | Status: DC
  Administered 2012-10-18 – 2012-10-20 (×4): 100 mg via ORAL
  Filled 2012-10-18 (×5): qty 1

## 2012-10-18 MED ORDER — DIPHENHYDRAMINE HCL 50 MG/ML IJ SOLN: 12.5000 mg | Freq: Four times a day (QID) | INTRAMUSCULAR | Status: DC | PRN

## 2012-10-18 MED ORDER — OXYCODONE HCL 5 MG/5ML PO SOLN
5.0000 mg | Freq: Once | ORAL | Status: DC | PRN
  Filled 2012-10-18: qty 5

## 2012-10-18 MED ORDER — PROPOFOL 10 MG/ML IV BOLUS
INTRAVENOUS | Status: DC | PRN
  Administered 2012-10-18: 200 mg via INTRAVENOUS

## 2012-10-18 MED ORDER — SUFENTANIL CITRATE 50 MCG/ML IV SOLN
INTRAVENOUS | Status: DC | PRN
  Administered 2012-10-18: 15 ug via INTRAVENOUS
  Administered 2012-10-18: 10 ug via INTRAVENOUS
  Administered 2012-10-18 (×2): 5 ug via INTRAVENOUS

## 2012-10-18 MED ORDER — HYDROMORPHONE HCL PF 1 MG/ML IJ SOLN
INTRAMUSCULAR | Status: DC | PRN
  Administered 2012-10-18: 1 mg via INTRAVENOUS

## 2012-10-18 MED ORDER — MEPERIDINE HCL 50 MG/ML IJ SOLN: 6.2500 mg | INTRAMUSCULAR | Status: DC | PRN

## 2012-10-18 MED ORDER — GLYCOPYRROLATE 0.2 MG/ML IJ SOLN
INTRAMUSCULAR | Status: DC | PRN
  Administered 2012-10-18: 0.6 mg via INTRAVENOUS

## 2012-10-18 MED ORDER — DEXTROSE-NACL 5-0.45 % IV SOLN
INTRAVENOUS | Status: DC
  Administered 2012-10-18 – 2012-10-19 (×3): via INTRAVENOUS

## 2012-10-18 MED ORDER — BUPIVACAINE LIPOSOME 1.3 % IJ SUSP
20.0000 mL | Freq: Once | INTRAMUSCULAR | Status: DC
  Filled 2012-10-18: qty 20

## 2012-10-18 MED ORDER — ZOLPIDEM TARTRATE 5 MG PO TABS: 5.0000 mg | ORAL_TABLET | Freq: Every evening | ORAL | Status: DC | PRN

## 2012-10-18 MED ORDER — SODIUM CHLORIDE 0.9 % IV SOLN
INTRAVENOUS | Status: DC | PRN
  Administered 2012-10-18: 07:00:00 via INTRAVENOUS

## 2012-10-18 MED ORDER — PROMETHAZINE HCL 25 MG/ML IJ SOLN: 6.2500 mg | INTRAMUSCULAR | Status: DC | PRN

## 2012-10-18 MED ORDER — MORPHINE SULFATE 2 MG/ML IJ SOLN
2.0000 mg | INTRAMUSCULAR | Status: DC | PRN
  Administered 2012-10-18: 2 mg via INTRAVENOUS
  Administered 2012-10-18: 4 mg via INTRAVENOUS
  Administered 2012-10-18: 2 mg via INTRAVENOUS
  Administered 2012-10-18 – 2012-10-19 (×4): 4 mg via INTRAVENOUS
  Filled 2012-10-18 (×4): qty 2
  Filled 2012-10-18: qty 1
  Filled 2012-10-18: qty 2
  Filled 2012-10-18: qty 1

## 2012-10-18 MED ORDER — DIPHENHYDRAMINE HCL 12.5 MG/5ML PO ELIX: 12.5000 mg | ORAL_SOLUTION | Freq: Four times a day (QID) | ORAL | Status: DC | PRN

## 2012-10-18 MED ORDER — BUPIVACAINE LIPOSOME 1.3 % IJ SUSP
INTRAMUSCULAR | Status: DC | PRN
  Administered 2012-10-18: 20 mL

## 2012-10-18 MED ORDER — LACTATED RINGERS IR SOLN
Status: DC | PRN
  Administered 2012-10-18: 1000 mL

## 2012-10-18 MED ORDER — CEFAZOLIN SODIUM 1-5 GM-% IV SOLN
1.0000 g | Freq: Three times a day (TID) | INTRAVENOUS | Status: AC
  Administered 2012-10-18 (×2): 1 g via INTRAVENOUS
  Filled 2012-10-18 (×2): qty 50

## 2012-10-18 MED ORDER — CISATRACURIUM BESYLATE (PF) 10 MG/5ML IV SOLN
INTRAVENOUS | Status: DC | PRN
  Administered 2012-10-18: 6 mg via INTRAVENOUS
  Administered 2012-10-18: 18 mg via INTRAVENOUS

## 2012-10-18 MED ORDER — HYDROMORPHONE HCL PF 1 MG/ML IJ SOLN
0.2500 mg | INTRAMUSCULAR | Status: DC | PRN
  Administered 2012-10-18 (×4): 0.5 mg via INTRAVENOUS

## 2012-10-18 MED ORDER — ACETAMINOPHEN 10 MG/ML IV SOLN
1000.0000 mg | Freq: Four times a day (QID) | INTRAVENOUS | Status: AC
  Administered 2012-10-18 – 2012-10-19 (×4): 1000 mg via INTRAVENOUS
  Filled 2012-10-18 (×5): qty 100

## 2012-10-18 MED ORDER — MANNITOL 25 % IV SOLN
INTRAVENOUS | Status: DC | PRN
  Administered 2012-10-18 (×2): 12.5 g via INTRAVENOUS

## 2012-10-18 MED ORDER — CEFAZOLIN SODIUM-DEXTROSE 2-3 GM-% IV SOLR
2.0000 g | INTRAVENOUS | Status: AC
  Administered 2012-10-18: 2 g via INTRAVENOUS

## 2012-10-18 MED ORDER — STERILE WATER FOR IRRIGATION IR SOLN
Status: DC | PRN
  Administered 2012-10-18: 3000 mL

## 2012-10-18 MED ORDER — LACTATED RINGERS IV SOLN
INTRAVENOUS | Status: DC | PRN
  Administered 2012-10-18 (×2): via INTRAVENOUS

## 2012-10-18 SURGICAL SUPPLY — 59 items
ADH SKN CLS APL DERMABOND .7 (GAUZE/BANDAGES/DRESSINGS) ×2
APL ESCP 34 STRL LF DISP (HEMOSTASIS)
APPLICATOR SURGIFLO ENDO (HEMOSTASIS) IMPLANT
BAG SPEC RTRVL LRG 6X4 10 (ENDOMECHANICALS) ×1
CHLORAPREP W/TINT 26ML (MISCELLANEOUS) ×2 IMPLANT
CLIP LIGATING HEM O LOK PURPLE (MISCELLANEOUS) ×1 IMPLANT
CLIP LIGATING HEMO O LOK GREEN (MISCELLANEOUS) ×2 IMPLANT
CLOTH BEACON ORANGE TIMEOUT ST (SAFETY) ×2 IMPLANT
CORDS BIPOLAR (ELECTRODE) ×2 IMPLANT
COVER SURGICAL LIGHT HANDLE (MISCELLANEOUS) ×2 IMPLANT
COVER TIP SHEARS 8 DVNC (MISCELLANEOUS) ×1 IMPLANT
COVER TIP SHEARS 8MM DA VINCI (MISCELLANEOUS) ×1
DECANTER SPIKE VIAL GLASS SM (MISCELLANEOUS) ×1 IMPLANT
DERMABOND ADVANCED (GAUZE/BANDAGES/DRESSINGS) ×2
DERMABOND ADVANCED .7 DNX12 (GAUZE/BANDAGES/DRESSINGS) ×2 IMPLANT
DRAIN CHANNEL 15F RND FF 3/16 (WOUND CARE) ×2 IMPLANT
DRAPE INCISE IOBAN 66X45 STRL (DRAPES) ×2 IMPLANT
DRAPE LAPAROSCOPIC ABDOMINAL (DRAPES) ×2 IMPLANT
DRAPE LG THREE QUARTER DISP (DRAPES) ×4 IMPLANT
DRAPE TABLE BACK 44X90 PK DISP (DRAPES) ×2 IMPLANT
DRAPE WARM FLUID 44X44 (DRAPE) ×2 IMPLANT
DRESSING SURGICEL FIBRLLR 1X2 (HEMOSTASIS) IMPLANT
DRSG SURGICEL FIBRILLAR 1X2 (HEMOSTASIS)
ELECT REM PT RETURN 9FT ADLT (ELECTROSURGICAL) ×2
ELECTRODE REM PT RTRN 9FT ADLT (ELECTROSURGICAL) ×1 IMPLANT
EVACUATOR SILICONE 100CC (DRAIN) ×2 IMPLANT
GAUZE VASELINE 3X9 (GAUZE/BANDAGES/DRESSINGS) IMPLANT
GLOVE BIO SURGEON STRL SZ 6.5 (GLOVE) ×2 IMPLANT
GLOVE BIOGEL M STRL SZ7.5 (GLOVE) ×4 IMPLANT
GOWN STRL NON-REIN LRG LVL3 (GOWN DISPOSABLE) ×4 IMPLANT
GOWN STRL REIN XL XLG (GOWN DISPOSABLE) ×2 IMPLANT
HEMOSTAT SURGICEL 4X8 (HEMOSTASIS) IMPLANT
KIT ACCESSORY DA VINCI DISP (KITS) ×1
KIT ACCESSORY DVNC DISP (KITS) ×1 IMPLANT
KIT BASIN OR (CUSTOM PROCEDURE TRAY) ×2 IMPLANT
PENCIL BUTTON HOLSTER BLD 10FT (ELECTRODE) ×2 IMPLANT
POSITIONER SURGICAL ARM (MISCELLANEOUS) ×2 IMPLANT
POUCH SPECIMEN RETRIEVAL 10MM (ENDOMECHANICALS) ×2 IMPLANT
SET TUBE IRRIG SUCTION NO TIP (IRRIGATION / IRRIGATOR) ×1 IMPLANT
SOLUTION ANTI FOG 6CC (MISCELLANEOUS) ×2 IMPLANT
SOLUTION ELECTROLUBE (MISCELLANEOUS) ×2 IMPLANT
SPONGE LAP 18X18 X RAY DECT (DISPOSABLE) ×2 IMPLANT
SURGIFLO W/THROMBIN 8M KIT (HEMOSTASIS) ×2 IMPLANT
SUT ETHILON 3 0 PS 1 (SUTURE) ×2 IMPLANT
SUT MNCRL AB 4-0 PS2 18 (SUTURE) ×4 IMPLANT
SUT V-LOC BARB 180 2/0GR6 GS22 (SUTURE) ×2
SUT VIC AB 0 CT1 27 (SUTURE) ×4
SUT VIC AB 0 CT1 27XBRD ANTBC (SUTURE) ×1 IMPLANT
SUT VICRYL 0 UR6 27IN ABS (SUTURE) ×4 IMPLANT
SUT VLOC BARB 180 ABS3/0GR12 (SUTURE) ×2
SUTURE V-LC BRB 180 2/0GR6GS22 (SUTURE) ×1 IMPLANT
SUTURE VLOC BRB 180 ABS3/0GR12 (SUTURE) ×1 IMPLANT
TOWEL OR NON WOVEN STRL DISP B (DISPOSABLE) ×2 IMPLANT
TRAY FOLEY CATH 16FRSI W/METER (SET/KITS/TRAYS/PACK) ×2 IMPLANT
TRAY LAP CHOLE (CUSTOM PROCEDURE TRAY) ×2 IMPLANT
TROCAR ENDOPATH XCEL 12X100 BL (ENDOMECHANICALS) ×2 IMPLANT
TROCAR XCEL 12X100 BLDLESS (ENDOMECHANICALS) ×2 IMPLANT
TUBING INSUFFLATION 10FT LAP (TUBING) ×2 IMPLANT
WATER STERILE IRR 1500ML POUR (IV SOLUTION) ×2 IMPLANT

## 2012-10-18 NOTE — Progress Notes (Signed)
Patient ID: Parker Ryan, male   DOB: [birth year], 47 y.o.   MRN: [id number]  Post-op note  Subjective: The patient is doing well.  No complaints.  Objective: Vital signs in last 24 hours: Temp:  [97.8 F (36.6 C)-98.2 F (36.8 C)] 97.9 F (36.6 C) (08/25 1230) Pulse Rate:  [59-74] 72 (08/25 1230) Resp:  [8-18] 16 (08/25 1230) BP: (121-138)/(77-87) 121/85 mmHg (08/25 1230) SpO2:  [96 %-100 %] 99 % (08/25 1230) Weight:  [89.812 kg (198 lb)] 89.812 kg (198 lb) (08/25 1230)  Intake/Output from previous day:   Intake/Output this shift: Total I/O In: 2000 [I.V.:2000] Out: 600 [Urine:390; Drains:110; Blood:100]  Physical Exam:  General: Alert and oriented. Abdomen: Soft, Nondistended. Incisions: Clean and dry.  Lab Results:  Recent Labs  10/18/12 1057  HGB 15.7  HCT 44.4    Assessment/Plan: POD#0   1) Continue to monitor   Parker Ryan. MD   LOS: 0 days   Parker Ryan,LES 10/18/2012, 4:07 PM

## 2012-10-18 NOTE — Transfer of Care (Signed)
Immediate Anesthesia Transfer of Care Note  Patient: Parker Ryan  Procedure(s) Performed: Procedure(s): ROBOTIC ASSITED PARTIAL NEPHRECTOMY (Right)  Patient Location: PACU  Anesthesia Type:General  Level of Consciousness: awake and sedated  Airway & Oxygen Therapy: Patient Spontanous Breathing and Patient connected to face mask oxygen  Post-op Assessment: Report given to PACU RN and Post -op Vital signs reviewed and stable  Post vital signs: stable  Complications: No apparent anesthesia complications

## 2012-10-18 NOTE — Anesthesia Postprocedure Evaluation (Signed)
Anesthesia Post Note  Patient: Parker Ryan  Procedure(s) Performed: Procedure(s) (LRB): ROBOTIC ASSITED PARTIAL NEPHRECTOMY (Right)  Anesthesia type: General  Patient location: PACU  Post pain: Pain level controlled  Post assessment: Post-op Vital signs reviewed  Last Vitals: BP 121/85  Pulse 72  Temp(Src) 36.6 C (Oral)  Resp 16  Ht 6\' 2"  (1.88 m)  Wt 198 lb (89.812 kg)  BMI 25.41 kg/m2  SpO2 99%  Post vital signs: Reviewed  Level of consciousness: sedated  Complications: No apparent anesthesia complications

## 2012-10-18 NOTE — Anesthesia Preprocedure Evaluation (Signed)
Anesthesia Evaluation  Patient identified by MRN, date of birth, ID band Patient awake    Reviewed: Allergy & Precautions, H&P , NPO status , Patient's Chart, lab work & pertinent test results  Airway       Dental  (+) Dental Advisory Given   Pulmonary shortness of breath and with exertion,          Cardiovascular hypertension, Pt. on medications     Neuro/Psych  Headaches, negative psych ROS   GI/Hepatic negative GI ROS, Neg liver ROS,   Endo/Other  negative endocrine ROS  Renal/GU Renal disease     Musculoskeletal negative musculoskeletal ROS (+)   Abdominal   Peds  Hematology negative hematology ROS (+)   Anesthesia Other Findings   Reproductive/Obstetrics                           Anesthesia Physical Anesthesia Plan  ASA: II  Anesthesia Plan: General   Post-op Pain Management:    Induction: Intravenous  Airway Management Planned: Oral ETT  Additional Equipment:   Intra-op Plan:   Post-operative Plan:   Informed Consent: I have reviewed the patients History and Physical, chart, labs and discussed the procedure including the risks, benefits and alternatives for the proposed anesthesia with the patient or authorized representative who has indicated his/her understanding and acceptance.   Dental advisory given  Plan Discussed with: CRNA  Anesthesia Plan Comments:         Anesthesia Quick Evaluation

## 2012-10-18 NOTE — Op Note (Signed)
Preoperative diagnosis: Right renal neoplasms  Postoperative diagnosis: Right renal neoplasms  Procedure:  1. Right robotic-assisted laparoscopic partial nephrectomy 2. Intraoperative renal ultrasonography  Surgeon: Moody Bruins. M.D.  Assistant(s): Pecola Leisure, PA-C  Anesthesia: General  Complications: None  EBL: 100 mL  IVF:  1300 mL crystalloid  Specimens: 1. Right upper pole renal neoplasm 2. Right lower pole renal cyst  Disposition of specimens: Pathology  Intraoperative findings:       1. Warm renal ischemia time: 18 minutes       2. Intraoperative renal ultrasound findings: Renal ultrasonography demonstrated a defined solid mass in the upper pole of the kidney measuring 1.3 x 1.1 cm.  This was completely endophytic.  No other solid appearing masses were noted.  Drains: 1. # 15 Blake perinephric drain  Indication:  Parker Ryan is a 47 y.o. year old patient with a right renal neoplasm.  After a thorough review of the management options for their renal mass, they elected to proceed with surgical treatment and the above procedure.  We have discussed the potential benefits and risks of the procedure, side effects of the proposed treatment, the likelihood of the patient achieving the goals of the procedure, and any potential problems that might occur during the procedure or recuperation. Informed consent has been obtained.   Description of procedure:  The patient was taken to the operating room and a general anesthetic was administered. The patient was given preoperative antibiotics, placed in the right modified flank position with care to pad all potential pressure points, and prepped and draped in the usual sterile fashion. Next a preoperative timeout was performed.  A site was selected on the right side of the umbilicus for placement of the camera port. This was placed using a standard open Hassan technique which allowed entry into the peritoneal  cavity under direct vision and without difficulty. A 12 mm port was placed and a pneumoperitoneum established. The camera was then used to inspect the abdomen and there was no evidence of any intra-abdominal injuries or other abnormalities. The remaining abdominal ports were then placed. 8 mm robotic ports were placed in the right upper quadrant, right lower quadrant, and far right lateral abdominal wall. A 12 mm port was placed in the upper midline for laparoscopic assistance. All ports were placed under direct vision without difficulty. The surgical cart was then docked.   Utilizing the cautery scissors, the white line of Toldt was incised allowing the colon to be mobilized medially and the plane between the mesocolon and the anterior layer of Gerota's fascia to be developed and the kidney to be exposed.  The ureter and gonadal vein were identified inferiorly and the ureter was lifted anteriorly off the psoas muscle.  Dissection proceeded superiorly along the gonadal vein until the renal vein was identified.  The renal hilum was then carefully isolated with a combination of blunt and sharp dissection allowing the renal arterial and venous structures to be separated and isolated in preparation for renal hilar vessel clamping. There was a single branching renal artery and a single renal vein.  12.5 g of IV mannitol was then administered.   Attention turned to the kidney and the perinephric fat surrounding the renal mass was removed and the kidney was mobilized sufficiently for exposure and resection of the renal mass. No obvious visible mass was identified on the upper pole of the kidney confirming the completely endophytic nature of the mass seen on preoperative imaging. A cystic lesion was noted  on the anterior aspect of the lower half of the kidney.  No complex features were grossly appreciated.  Intraoperative renal ultrasonography was utilized with the laparoscopic ultrasound probe to identify the renal  tumor and identify the tumor margins. The margins were carefully marked with cautery on the surface of the renal capsule.  Once the renal mass was properly isolated, preparations were made for resection of the tumor.  Reconstructive sutures were placed into the abdomen for the renorrhaphy portion of the procedure.  The lower pole renal cystic lesion did have questionable features on preoperative imaging.  Therefore, it was excised with cautery.  As it was very superficial, renal ischemia was not required for this portion of the procedure. The renal artery was then clamped with bulldog clamps.  The upper pole marked tumor site was then excised with cold scissor dissection along with an adequate visible gross margin of normal renal parenchyma. The tumor appeared to be excised without any gross violation of the tumor although the tumor was not readily visible.  The excised portion of the kidney was sent for intraoperative identification and did grossly confirm the presence of a renal mass within the resected specimen. The renal collecting system was not entered during removal of the tumor.  A running 3-0 V-lock suture was then brought through the capsule of the kidney and run along the base of the renal defect to provide hemostasis and close any entry into the renal collecting system if present. Weck clips were used to secure this suture outside the renal capsule at the proximal and distal ends. An additional hemostatic agent (Surgiflo) was then placed into the renal defect. A running 2-0 V lock suture was then used to close the capsule of the kidney using a sliding clip technique which resulted in excellent hemostasis.  Additonal Surgiflo was placed in the lower pole defect where the cystic lesion was removed.  Hemostasis appeared excellent.  The bulldog clamps were then removed from the renal hilar vessel(s) and an additional 12.5 g of IV mannitol was administered. Total warm renal ischemia time was 18 minutes.  The renal tumor resection site was examined. Hemostasis appeared adequate.   The kidney was placed back into its normal anatomic position and covered with perinephric fat as needed.  A # 15 Blake drain was then brought through the lateral lower port site and positioned in the perinephric space.  It was secured to the skin with a nylon suture. The surgical cart was undocked.  The renal tumor specimen was removed intact within an endopouch retrieval bag via the camera port sites.  The camera port site and the other 12 mm port site were then closed at the fascial level with 0-vicryl suture.  All other laparoscopic/robotic ports were removed under direct vision and the pneumoperitoneum let down with inspection of the operative field performed and hemostasis again confirmed. All incision sites were then injected with local anesthetic and reapproximated at the skin level with 4-0 monocryl subcuticular closures.  Dermabond was applied to the skin.  The patient tolerated the procedure well and without complications.  The patient was able to be extubated and transferred to the recovery unit in satisfactory condition.  Moody Bruins MD

## 2012-10-19 ENCOUNTER — Encounter (HOSPITAL_COMMUNITY): Payer: Self-pay | Admitting: Urology

## 2012-10-19 LAB — BASIC METABOLIC PANEL
CO2: 31 mEq/L (ref 19–32)
Chloride: 100 mEq/L (ref 96–112)
GFR calc Af Amer: 73 mL/min — ABNORMAL LOW (ref >90)
Potassium: 3.7 mEq/L (ref 3.5–5.1)

## 2012-10-19 LAB — HEMOGLOBIN AND HEMATOCRIT, BLOOD
HCT: 43.2 % (ref 39.0–52.0)
Hemoglobin: 15 g/dL (ref 13.0–17.0)

## 2012-10-19 MED ORDER — BISACODYL 10 MG RE SUPP
10.0000 mg | Freq: Once | RECTAL | Status: AC
  Administered 2012-10-19: 10 mg via RECTAL
  Filled 2012-10-19: qty 1

## 2012-10-19 MED ORDER — HYDROCODONE-ACETAMINOPHEN 5-325 MG PO TABS
1.0000 | ORAL_TABLET | Freq: Four times a day (QID) | ORAL | Status: DC | PRN
  Administered 2012-10-19: 1 via ORAL
  Administered 2012-10-19: 2 via ORAL
  Administered 2012-10-19: 1 via ORAL
  Administered 2012-10-20 (×2): 2 via ORAL
  Filled 2012-10-19 (×5): qty 2

## 2012-10-19 NOTE — Progress Notes (Signed)
With assistance pt did ambulate in hallway, tolerated well.

## 2012-10-19 NOTE — Progress Notes (Signed)
Much encouragement required for pt to dangle on bedside. Pt did with moderate assistance stand and take two steps, he then stated he was going to blackout, assisted back to bed. Vital signs stable. Reassured.

## 2012-10-19 NOTE — Progress Notes (Signed)
Patient ID: Parker Ryan, male   DOB: [birth year], 47 y.o.   MRN: [id number]  1 Day Post-Op Subjective: The patient is doing well.  No nausea or vomiting. Pain is adequately controlled.  Objective: Vital signs in last 24 hours: Temp:  [97.8 F (36.6 C)-98.3 F (36.8 C)] 98.2 F (36.8 C) (08/26 0600) Pulse Rate:  [57-74] 57 (08/26 0600) Resp:  [8-18] 16 (08/26 0600) BP: (121-143)/(77-90) 138/85 mmHg (08/26 0600) SpO2:  [98 %-100 %] 99 % (08/26 0600) Weight:  [89.812 kg (198 lb)] 89.812 kg (198 lb) (08/25 1230)  Intake/Output from previous day: 08/25 0701 - 08/26 0700 In: 3207.5 [I.V.:2687.5; IV Piggyback:400] Out: 1530 [Urine:1240; Drains:190; Blood:100] Intake/Output this shift: Total I/O In: 270 [Other:120; IV Piggyback:150] Out: 250 [Urine:250]  Physical Exam:  General: Alert and oriented. CV: RRR Lungs: Clear bilaterally. GI: Soft, Nondistended. Incisions: Clean and dry. Urine: Clear Extremities: Nontender, no erythema, no edema.  Lab Results:  Recent Labs  10/18/12 1057 10/19/12 0342  HGB 15.7 15.0  HCT 44.4 43.2          Recent Labs  10/13/12 0850 10/18/12 1057 10/19/12 0342  CREATININE 1.17 1.29 1.33           Results for orders placed during the hospital encounter of 10/18/12 (from the past 24 hour(s))  BASIC METABOLIC PANEL     Status: Abnormal   Collection Time    10/18/12 10:57 AM      Result Value Range   Sodium 136  135 - 145 mEq/L   Potassium 4.2  3.5 - 5.1 mEq/L   Chloride 101  96 - 112 mEq/L   CO2 27  19 - 32 mEq/L   Glucose, Bld 132 (*) 70 - 99 mg/dL   BUN 14  6 - 23 mg/dL   Creatinine, Ser 4.09  0.50 - 1.35 mg/dL   Calcium 8.4  8.4 - 81.1 mg/dL   GFR calc non Af Amer 65 (*) >90 mL/min   GFR calc Af Amer 75 (*) >90 mL/min  HEMOGLOBIN AND HEMATOCRIT, BLOOD     Status: None   Collection Time    10/18/12 10:57 AM      Result Value Range   Hemoglobin 15.7  13.0 - 17.0 g/dL   HCT 91.4  78.2 - 95.6 %  BASIC METABOLIC PANEL      Status: Abnormal   Collection Time    10/19/12  3:42 AM      Result Value Range   Sodium 135  135 - 145 mEq/L   Potassium 3.7  3.5 - 5.1 mEq/L   Chloride 100  96 - 112 mEq/L   CO2 31  19 - 32 mEq/L   Glucose, Bld 133 (*) 70 - 99 mg/dL   BUN 11  6 - 23 mg/dL   Creatinine, Ser 2.13  0.50 - 1.35 mg/dL   Calcium 8.5  8.4 - 08.6 mg/dL   GFR calc non Af Amer 63 (*) >90 mL/min   GFR calc Af Amer 73 (*) >90 mL/min  HEMOGLOBIN AND HEMATOCRIT, BLOOD     Status: None   Collection Time    10/19/12  3:42 AM      Result Value Range   Hemoglobin 15.0  13.0 - 17.0 g/dL   HCT 57.8  46.9 - 62.9 %    Assessment/Plan: POD# 1 s/p robotic partial nephrectomy.  1) Ambulate, Incentive spirometry 2) Advance diet as tolerated 3) Transition to oral pain medication 4) Dulcolax suppository 5) D/C urethral  catheter   Moody Bruins. MD   LOS: 1 day   Ripley Bogosian,LES 10/19/2012, 6:42 AM

## 2012-10-20 LAB — BASIC METABOLIC PANEL
GFR calc Af Amer: 81 mL/min — ABNORMAL LOW (ref >90)
GFR calc non Af Amer: 70 mL/min — ABNORMAL LOW (ref >90)
Potassium: 3.3 mEq/L — ABNORMAL LOW (ref 3.5–5.1)
Sodium: 136 mEq/L (ref 135–145)

## 2012-10-20 LAB — CREATININE, FLUID (PLEURAL, PERITONEAL, JP DRAINAGE): Creat, Fluid: 1.2 mg/dL

## 2012-10-20 MED ORDER — DSS 100 MG PO CAPS: 100.0000 mg | ORAL_CAPSULE | Freq: Two times a day (BID) | ORAL | Status: DC

## 2012-10-20 MED ORDER — SIMETHICONE 80 MG PO CHEW
80.0000 mg | CHEWABLE_TABLET | ORAL | Status: DC | PRN
  Administered 2012-10-20: 80 mg via ORAL
  Filled 2012-10-20: qty 1

## 2012-10-20 MED ORDER — HYDROCODONE-ACETAMINOPHEN 5-325 MG PO TABS: 1.0000 | ORAL_TABLET | Freq: Four times a day (QID) | ORAL | Status: DC | PRN

## 2012-10-20 NOTE — Progress Notes (Signed)
Patient ID: Parker Ryan, male   DOB: February 03, 1966, 47 y.o.   MRN: [id number]  2 Days Post-Op Subjective: The patient is doing well.  No nausea or vomiting. Pain is adequately controlled.  Objective: Vital signs in last 24 hours: Temp:  [97.7 F (36.5 C)-99.7 F (37.6 C)] 97.7 F (36.5 C) (08/27 0628) Pulse Rate:  [60-89] 85 (08/27 0628) Resp:  [12-16] 12 (08/27 0628) BP: (129-152)/(79-89) 152/89 mmHg (08/27 0628) SpO2:  [94 %-99 %] 96 % (08/27 0628)  Intake/Output from previous day: 08/26 0701 - 08/27 0700 In: 1647.5 [P.O.:120; I.V.:1452.5] Out: 1740 [Urine:1700; Drains:40] Intake/Output this shift:    Physical Exam:  General: Alert and oriented. CV: RRR Lungs: Clear bilaterally. GI: Soft, Nondistended. Incisions: Clean and dry. Urine: Clear Extremities: Nontender, no erythema, no edema.  Lab Results:  Recent Labs  10/18/12 1057 10/19/12 0342  HGB 15.7 15.0  HCT 44.4 43.2          Recent Labs  10/18/12 1057 10/19/12 0342 10/20/12 0330  CREATININE 1.29 1.33 1.21           Results for orders placed during the hospital encounter of 10/18/12 (from the past 24 hour(s))  BASIC METABOLIC PANEL     Status: Abnormal   Collection Time    10/20/12  3:30 AM      Result Value Range   Sodium 136  135 - 145 mEq/L   Potassium 3.3 (*) 3.5 - 5.1 mEq/L   Chloride 101  96 - 112 mEq/L   CO2 28  19 - 32 mEq/L   Glucose, Bld 127 (*) 70 - 99 mg/dL   BUN 10  6 - 23 mg/dL   Creatinine, Ser 4.09  0.50 - 1.35 mg/dL   Calcium 8.4  8.4 - 81.1 mg/dL   GFR calc non Af Amer 70 (*) >90 mL/min   GFR calc Af Amer 81 (*) >90 mL/min    Assessment/Plan: POD# 2 s/p robotic partial nephrectomy.  1) Ambulate, Incentive spirometry 2) Check drain creatinine level 3) SL IVF 4) D/C home  Moody Bruins. MD   LOS: 2 days   Avacyn Kloosterman,LES 10/20/2012, 7:21 AM

## 2012-10-20 NOTE — Progress Notes (Signed)
Pt left at 1445 this day with his mother at his side.  Pt alert, oriented, and without c/o. Discharge instructions/prescriptions given/explained with pt verbalizing understanding.  JP drain d/c and new dressing intact. Followup appointment noted.

## 2012-10-21 NOTE — Discharge Summary (Signed)
Date of admission: 10/18/2012  Date of discharge: 10/20/12  Admission diagnosis: Right renal mass  Discharge diagnosis: same  Secondary diagnoses: hypercholesterolemia, HTN, nephrolithiasis  History and Physical: For full details, please see admission history and physical. Briefly, Parker Ryan is a 47 y.o. year old patient seen for a right renal mass which was incidentally detected. He initially developed left flank pain last year and was found to have a ureteral stone which he was able to spontaneously pass. This was noted on an unenhanced CT scan. As part of that scan, there was a very small exophytic lesion seen on the right kidney and this was reimaged with a CT scan with contrast. 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 Incidentally, he was noted to have an endophytic lesion in the upper pole of the right kidney that did appear to enhance and measured approximately 1.3 cm. This lesion did raise concern for renal cell carcinoma. In retrospect, this lesion was not very well seen on either of his CT scans previously. There is no family history of kidney cancer and no family history of end-stage renal disease. He denies any hematuria, abdominal pain, or weight loss.   Hospital Course: Pt was admitted and taken to the OR on 10/18/12 for robotic assisted laparoscopic right partial nephrectomy.  Pt tolerated the procedure well and was hemodynamically stable immediately post op.  He was extubated without complication and woke up from anesthesia neurologically intact. His post op course progressed as expected and his vitals remained stable.  JP drain fluid was checked for Cr and this was consistent with serum, therefore, the drain  was removed on POD 1.  His foley was removed and he was able to void.  He tolerated a regular diet and was ambulating without difficulty.  He was felt stable for d/c home on POD 2.    Laboratory values:  Recent Labs  10/19/12 0342  HGB 15.0  HCT 43.2    Recent Labs  10/19/12 0342 10/20/12 0330  CREATININE 1.33 1.21    Disposition: Home  Discharge instruction: The patient was instructed to be ambulatory but told to refrain from heavy lifting, strenuous activity, or driving.  Discharge medications:    Medication List    STOP taking these medications       aspirin 81 MG tablet      TAKE these medications       DSS 100 MG Caps  Take 100 mg by mouth 2 (two) times daily.     HYDROcodone-acetaminophen 5-325 MG per tablet  Commonly known as:  NORCO/VICODIN  Take 1-2 tablets by mouth every 6 (six) hours as needed.     lisinopril 20 MG tablet  Commonly known as:  PRINIVIL,ZESTRIL  TAKE 1 TABLET BY MOUTH EVERY DAY     lisinopril 20 MG tablet  Commonly known as:  PRINIVIL,ZESTRIL  Take 20 mg by mouth at bedtime.     niacin 1000 MG CR tablet  Commonly known as:  NIASPAN  Take 1 tablet (1,000 mg total) by mouth at bedtime.        Followup:      Follow-up Information   Follow up with BORDEN,LES, MD On 11/10/2012. (at 12:30)    Specialty:  Urology   Contact information:   [address] [place] [address] [phone]

## 2012-10-21 NOTE — Progress Notes (Signed)
FINAL DIAGNOSIS Diagnosis 1. 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 - DEFINITIVE LYMPH/VASCULAR INVASION IS NOT IDENTIFIED. - PERIPHERAL RENAL PARENCHYMAL MARGIN IS POSITIVE. - OTHER MARGINS ARE NEGATIVE. - SEE ONCOLOGY TEMPLATE. 2. Kidney, biopsy, right lower pole cyst - SIMPLE CYST WITH CLEAR CELL LINING, SEE COMMENT. - NO EVIDENCE OF MALIGNANCY. Microscopic Comment 1. KIDNEY (updated 2014) Specimen, including laterality: Right upper pole partial

## 2013-01-29 ENCOUNTER — Other Ambulatory Visit: Payer: Self-pay | Admitting: Family Medicine

## 2013-02-11 ENCOUNTER — Other Ambulatory Visit (HOSPITAL_COMMUNITY): Payer: Self-pay | Admitting: Urology

## 2013-02-11 ENCOUNTER — Ambulatory Visit (HOSPITAL_COMMUNITY)
Admission: RE | Admit: 2013-02-11 | Discharge: 2013-02-11 | Disposition: A | Discharge status: Home / Self Care | Payer: Commercial Indemnity | Source: Ambulatory Visit | Attending: Urology | Admitting: Urology

## 2013-02-11 DIAGNOSIS — Z85528 Personal history of other malignant neoplasm of kidney: Secondary | ICD-10-CM | POA: Insufficient documentation

## 2013-02-11 DIAGNOSIS — C649 Malignant neoplasm of unspecified kidney, except renal pelvis: Secondary | ICD-10-CM

## 2013-03-21 ENCOUNTER — Encounter: Payer: Self-pay | Admitting: Family Medicine

## 2013-05-25 IMAGING — CT CT ABDOMEN W/ CM
2 of 6 series · 17 of 46 positions shown, 19 images · IV contrast (Omnipaque 300)
Comparison: CT 02/01/2012

CLINICAL DATA: Right kidney stone.  Abnormal ultrasound.

CT ABDOMEN WITH CONTRAST
TECHNIQUE: Multidetector CT imaging of the abdomen was performed
following the standard protocol during bolus administration of
intravenous contrast.
Contrast: 100mL OMNIPAQUE IOHEXOL 300 MG/ML  SOLN

[Series 3: abd/ pel 5mm · axial · 0.69mm/px · z∈[-372,-72]mm · 14 of 68 slices shown, 16 images]
[im 4/68  soft-tissue]
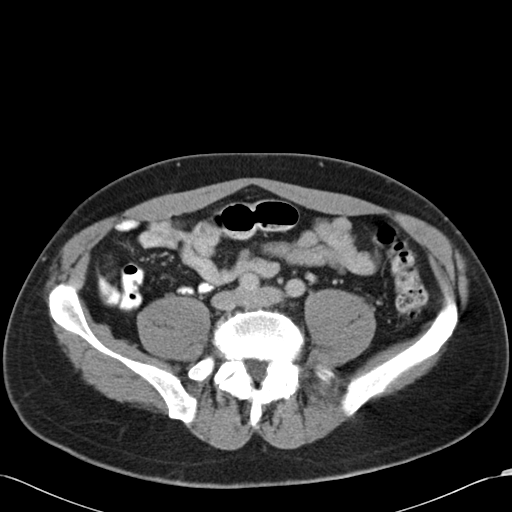
[im 4/68  bone]
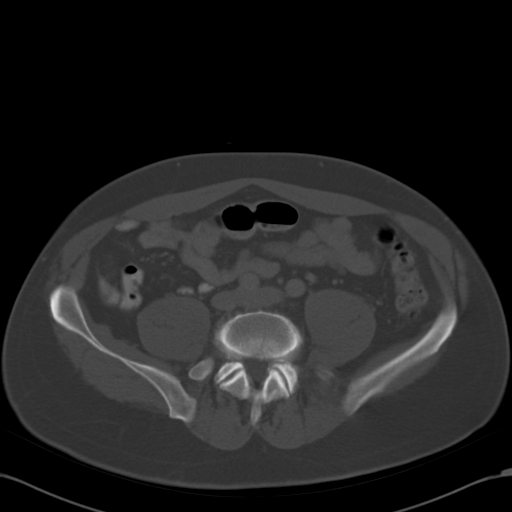
[im 8/68  soft-tissue]
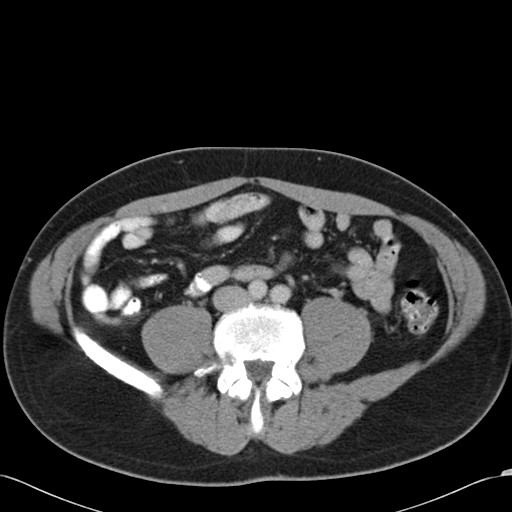
[im 12/68  soft-tissue]
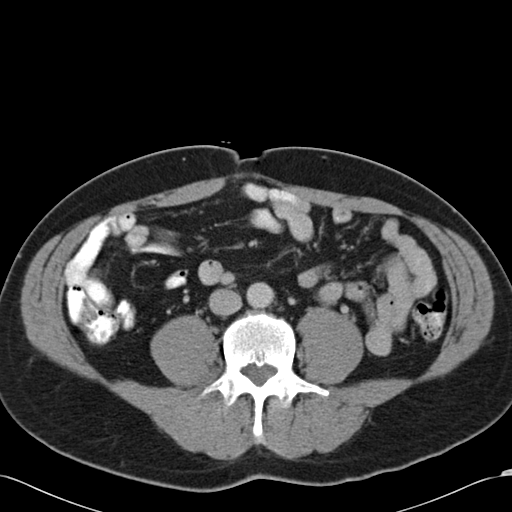
[im 20/68  soft-tissue]
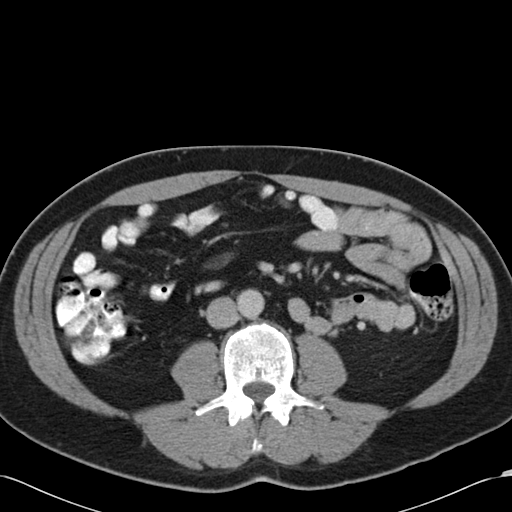
[im 24/68  soft-tissue]
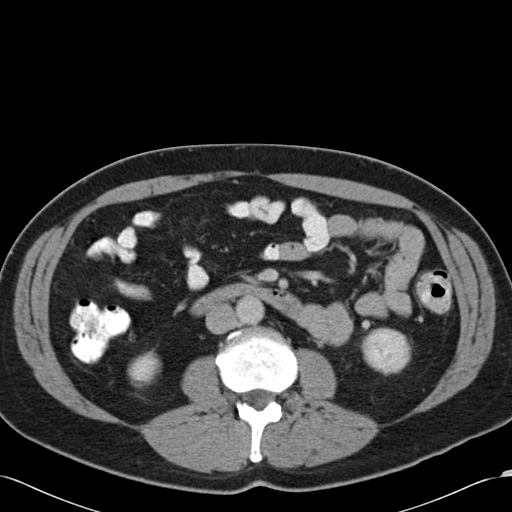
[im 28/68  soft-tissue]
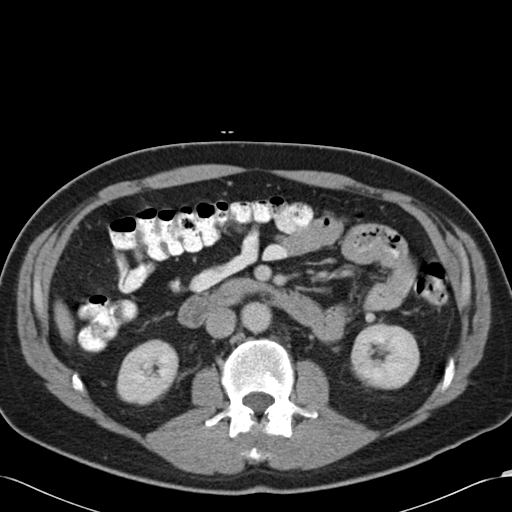
[im 32/68  soft-tissue]
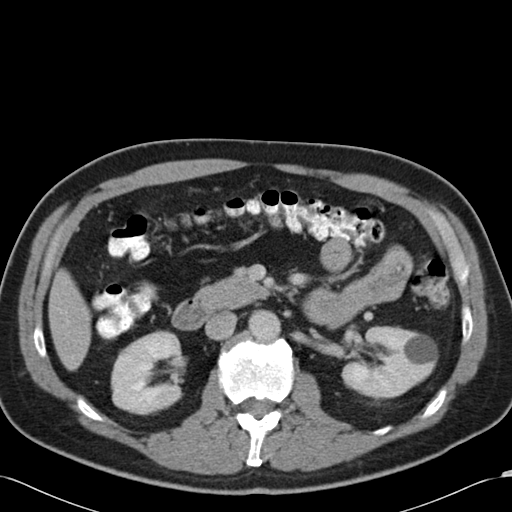
[im 36/68  soft-tissue]
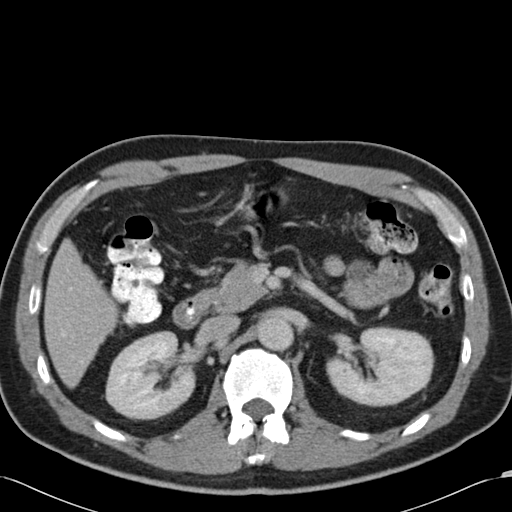
[im 40/68  soft-tissue]
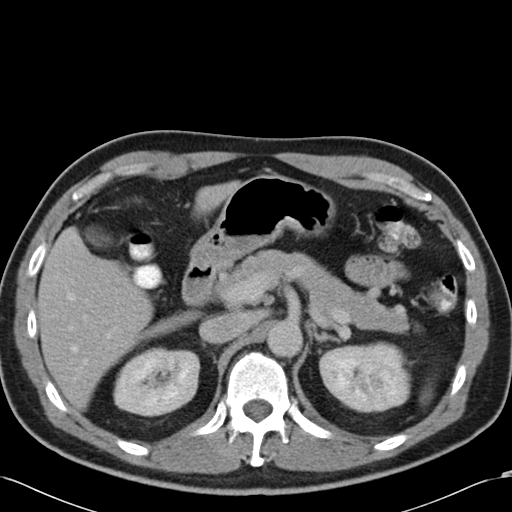
[im 40/68  bone]
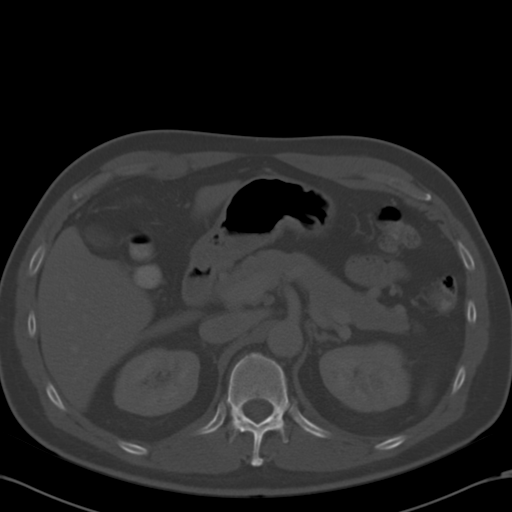
[im 44/68  soft-tissue]
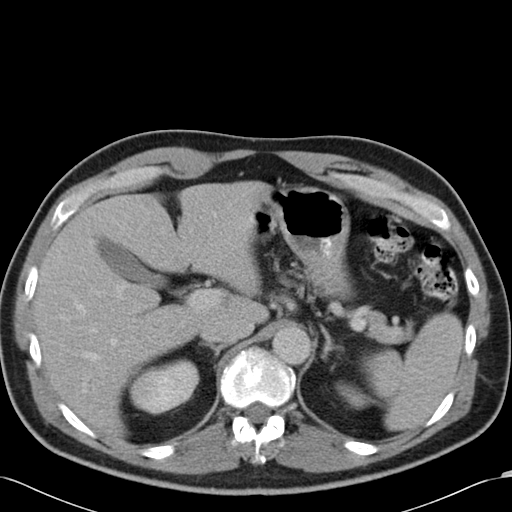
[im 52/68  soft-tissue]
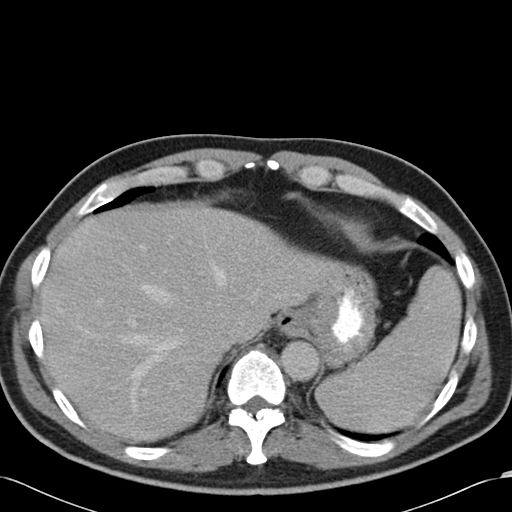
[im 56/68  soft-tissue]
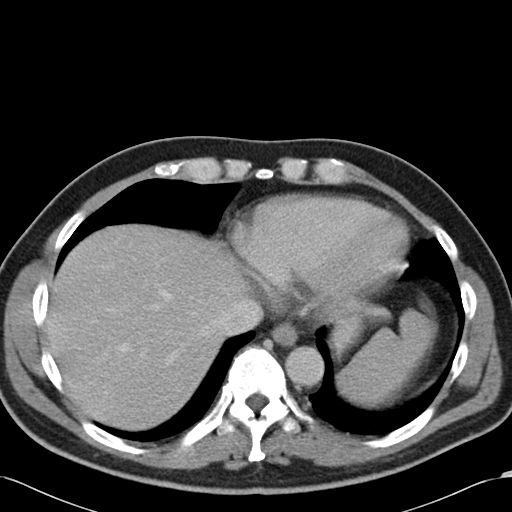
[im 60/68  soft-tissue]
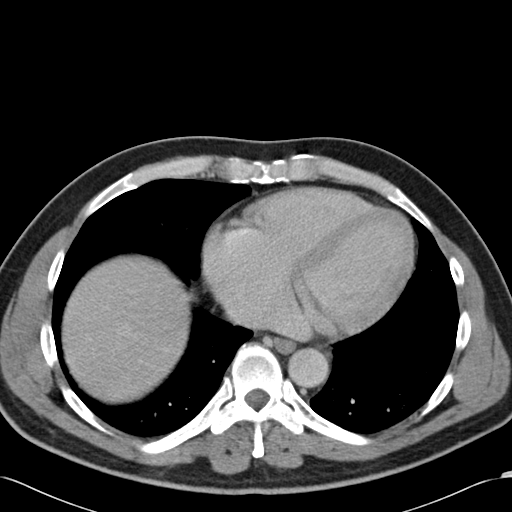
[im 64/68  soft-tissue]
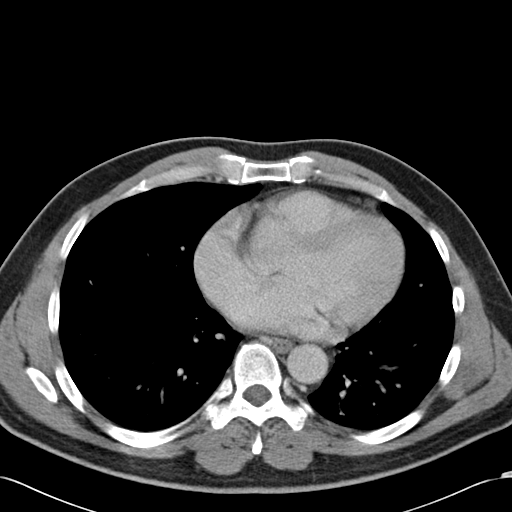

[Series 602: cor · coronal · 0.69mm/px · 3 of 108 slices shown]
[im 36/108  soft-tissue]
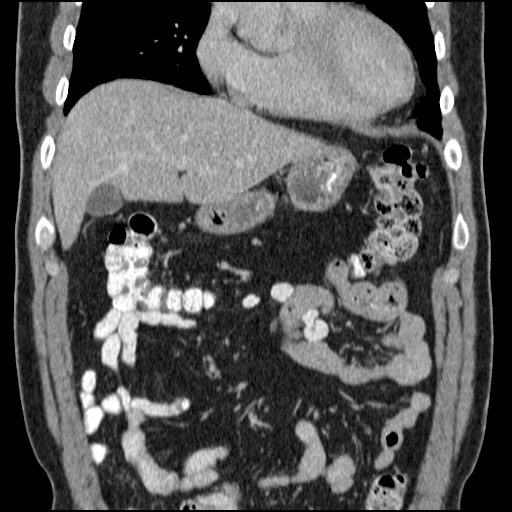
[im 48/108  soft-tissue]
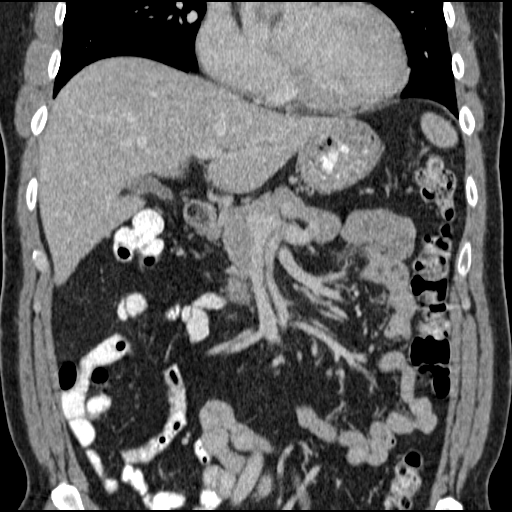
[im 60/108  soft-tissue]
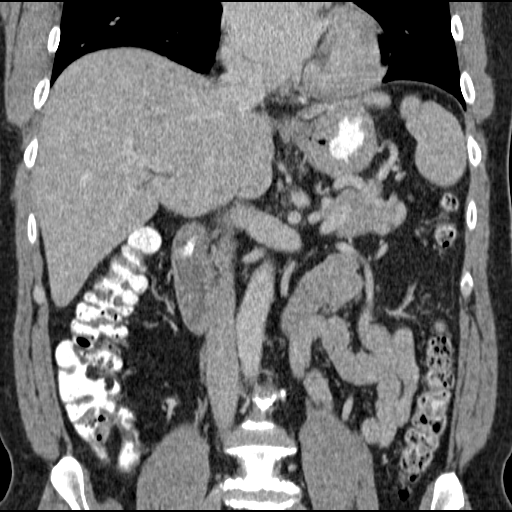

[17 of 46 positions shown; findings below may reference images not displayed]

FINDINGS: Renal:  Small exophytic lesion in the anterior right kidney
measures 8 mm (image 37). Difficult to separate enhancement from
pseudo enhancement of this small lesion therefore lesion remains
indeterminate.  There is a simple cyst within the left kidney.  The
left ureteral calculus identified  on comparison is no longer
present.  There is no filling defect within the collecting system
or ureter on the left or right.

Lung bases are clear.  No pericardial fluid.  No focal hepatic
lesion.  The gallbladder, pancreas, spleen, adrenal glands are
normal.  The stomach, small bowel and colon are normal.  There are
several diverticula of the descending colon.  Abdominal aorta
normal caliber.  No retroperitoneal periportal lymphadenopathy.
Review of  bone windows demonstrates no aggressive osseous lesions.
IMPRESSION: 1..  Small exophytic lesion is in the right kidney cannot be fully
characterized secondary to small size.  Cannot exclude enhancing
lesion.  Recommend follow-up renal MRI with and without contrast in
6 months.
2.  Interval clearing of left ureteral calculi and resolution of
the obstructive uropathy.
3.  Benign Bosniak I renal cyst of the left kidney.

## 2013-09-07 ENCOUNTER — Ambulatory Visit (HOSPITAL_COMMUNITY)
Admission: RE | Admit: 2013-09-07 | Discharge: 2013-09-07 | Disposition: A | Discharge status: Home / Self Care | Payer: BC Managed Care – PPO | Source: Ambulatory Visit | Attending: Urology | Admitting: Urology

## 2013-09-07 ENCOUNTER — Other Ambulatory Visit (HOSPITAL_COMMUNITY): Payer: Self-pay | Admitting: Urology

## 2013-09-07 DIAGNOSIS — C649 Malignant neoplasm of unspecified kidney, except renal pelvis: Secondary | ICD-10-CM

## 2013-09-07 DIAGNOSIS — Z85528 Personal history of other malignant neoplasm of kidney: Secondary | ICD-10-CM | POA: Insufficient documentation

## 2013-09-25 ENCOUNTER — Encounter: Payer: Self-pay | Admitting: Family Medicine

## 2013-11-09 ENCOUNTER — Other Ambulatory Visit: Payer: Self-pay | Admitting: Family Medicine

## 2013-12-22 ENCOUNTER — Ambulatory Visit (INDEPENDENT_AMBULATORY_CARE_PROVIDER_SITE_OTHER): Payer: BC Managed Care – PPO | Admitting: Family Medicine

## 2013-12-22 ENCOUNTER — Encounter: Payer: Self-pay | Admitting: Family Medicine

## 2013-12-22 VITALS — BP 120/70 | HR 55 | Temp 97.9°F | Ht 72.75 in | Wt 205.2 lb

## 2013-12-22 DIAGNOSIS — R739 Hyperglycemia, unspecified: Secondary | ICD-10-CM

## 2013-12-22 DIAGNOSIS — M25562 Pain in left knee: Secondary | ICD-10-CM

## 2013-12-22 DIAGNOSIS — Z7189 Other specified counseling: Secondary | ICD-10-CM

## 2013-12-22 DIAGNOSIS — I10 Essential (primary) hypertension: Secondary | ICD-10-CM

## 2013-12-22 DIAGNOSIS — Z0000 Encounter for general adult medical examination without abnormal findings: Secondary | ICD-10-CM

## 2013-12-22 DIAGNOSIS — Z23 Encounter for immunization: Secondary | ICD-10-CM

## 2013-12-22 DIAGNOSIS — C642 Malignant neoplasm of left kidney, except renal pelvis: Secondary | ICD-10-CM

## 2013-12-22 DIAGNOSIS — E785 Hyperlipidemia, unspecified: Secondary | ICD-10-CM

## 2013-12-22 LAB — LIPID PANEL
CHOL/HDL RATIO: 6
Cholesterol: 157 mg/dL (ref 0–200)
HDL: 26.3 mg/dL — ABNORMAL LOW (ref >39.00)
NONHDL: 130.7
TRIGLYCERIDES: 364 mg/dL — AB (ref 0.0–149.0)
VLDL: 72.8 mg/dL — ABNORMAL HIGH (ref 0.0–40.0)

## 2013-12-22 LAB — LDL CHOLESTEROL, DIRECT: Direct LDL: 64.6 mg/dL

## 2013-12-22 MED ORDER — LISINOPRIL 20 MG PO TABS: ORAL_TABLET | ORAL | Status: DC

## 2013-12-22 MED ORDER — TRAMADOL HCL 50 MG PO TABS: 50.0000 mg | ORAL_TABLET | Freq: Two times a day (BID) | ORAL | Status: DC | PRN

## 2013-12-22 NOTE — Patient Instructions (Addendum)
Go to the lab on the way out.  We'll contact you with your lab report. Use the knee strap, ice, and tramadol for the knee pain.  If not better, then notify me.  Take care.  Glad to see you.

## 2013-12-22 NOTE — Progress Notes (Signed)
Pre visit review using our clinic review tool, if applicable. No additional management support is needed unless otherwise documented below in the visit note.  CPE- See plan.  Routine anticipatory guidance given to patient.  See health maintenance.  Tetanus 2006 Flu shot today.   Shingles and PNA not due.  D/w pt.  Colon and prostate cancer screening not due.  D/w pt.  Colon screening likely due at 13.  Living will.  Wife designate if patient were incapacitated.  Diet is good. Working on exercise, basketball.    Psoriasis.  Mild sx usually, occ flares.  Has f/u with derm pending.   Hypertension:    Using medication without problems or lightheadedness: yes Chest pain with exertion:no Edema:no Short of breath:no  Prev on niacin for HLD.  D/w pt about options.  See results/comments on labs.  Off niaspan for about 1 month.   We talked about niacin affecting lab results but not having great outcome data o/w.   L knee pain and swelling after basketball.  No injury.  Full knee bracing doesn't help.  Ibuprofen helps with the swelling.  He ices his knee.  He is sore the next day, then the second day is worse.  He'll get local swelling. No bruising.  No pop/click usually, but occ click at the inferior knee cap.  He is usually taking 2400mg  of ibuprofen a week.  dw pt about nsaids and ace use.   PMH and SH reviewed  Meds, vitals, and allergies reviewed.   ROS: See HPI.  Otherwise negative.    GEN: nad, alert and oriented HEENT: mucous membranes moist NECK: supple w/o LA CV: rrr. PULM: ctab, no inc wob ABD: soft, +bs EXT: no edema SKIN: no acute rash, small patches of psoriasis noted.   L knee with ACL MCL LCL intact.  No meniscal click but patellar crepitus noted.  No joint line pain, no bruising or swelling.

## 2013-12-23 ENCOUNTER — Telehealth: Payer: Self-pay | Admitting: Family Medicine

## 2013-12-23 DIAGNOSIS — Z7189 Other specified counseling: Secondary | ICD-10-CM | POA: Insufficient documentation

## 2013-12-23 DIAGNOSIS — M25569 Pain in unspecified knee: Secondary | ICD-10-CM | POA: Insufficient documentation

## 2013-12-23 NOTE — Assessment & Plan Note (Addendum)
Routine anticipatory guidance given to patient.  See health maintenance.  Tetanus 2006 Flu shot today.   Shingles and PNA not due.  D/w pt.  Colon and prostate cancer screening not due.  D/w pt.  Colon screening likely due at 36.  Living will d/w pt.  Wife designated if patient were incapacitated.  Diet is good. Working on exercise, basketball.

## 2013-12-23 NOTE — Assessment & Plan Note (Addendum)
Prev on niacin for HLD.  D/w pt about options.  See results/comments on labs.  Off niaspan for about 1 month.   We talked about niacin affecting lab results but not having great outcome data o/w.

## 2013-12-23 NOTE — Telephone Encounter (Signed)
emmi emailed °

## 2013-12-23 NOTE — Assessment & Plan Note (Signed)
Controlled, continue diet and exercise. Limit nsaids.  cmet okay 09/07/13 at uro office, reviewed. See notes on lipids.

## 2013-12-23 NOTE — Assessment & Plan Note (Signed)
Per uro.

## 2013-12-23 NOTE — Assessment & Plan Note (Signed)
With likely OA, d/w pt.  Limit nsaids with ace use.  He'll try tramadol with ice and patellar strap.  Notify me not tolerated.  Routine cautions given.

## 2013-12-26 ENCOUNTER — Other Ambulatory Visit: Payer: Self-pay | Admitting: Family Medicine

## 2013-12-26 DIAGNOSIS — E785 Hyperlipidemia, unspecified: Secondary | ICD-10-CM

## 2013-12-26 MED ORDER — ATORVASTATIN CALCIUM 10 MG PO TABS: 10.0000 mg | ORAL_TABLET | Freq: Every day | ORAL | Status: DC

## 2014-03-24 ENCOUNTER — Other Ambulatory Visit (HOSPITAL_COMMUNITY): Payer: Self-pay | Admitting: Urology

## 2014-03-24 ENCOUNTER — Ambulatory Visit (HOSPITAL_COMMUNITY)
Admission: RE | Admit: 2014-03-24 | Discharge: 2014-03-24 | Disposition: A | Discharge status: Home / Self Care | Payer: Managed Care, Other (non HMO) | Source: Ambulatory Visit | Attending: Urology | Admitting: Urology

## 2014-03-24 DIAGNOSIS — Z85528 Personal history of other malignant neoplasm of kidney: Secondary | ICD-10-CM | POA: Diagnosis present

## 2014-04-01 ENCOUNTER — Other Ambulatory Visit: Payer: Self-pay | Admitting: Family Medicine

## 2014-10-04 ENCOUNTER — Telehealth: Payer: Self-pay

## 2014-10-04 NOTE — Telephone Encounter (Signed)
Pt left v/m requesting refill lisinopril and niaspan to [facility]. Left v/m requesting pt to cb.

## 2014-10-04 NOTE — Telephone Encounter (Signed)
Pt called back and pt will contact [facility] and have remaining refills for lisinopril and niaspan transferred from [facility]. Pt voiced understanding.

## 2014-10-13 ENCOUNTER — Other Ambulatory Visit (HOSPITAL_COMMUNITY): Payer: Self-pay | Admitting: Urology

## 2014-10-13 ENCOUNTER — Ambulatory Visit (HOSPITAL_COMMUNITY)
Admission: RE | Admit: 2014-10-13 | Discharge: 2014-10-13 | Disposition: A | Discharge status: Home / Self Care | Payer: Managed Care, Other (non HMO) | Source: Ambulatory Visit | Attending: Urology | Admitting: Urology

## 2014-10-13 DIAGNOSIS — C649 Malignant neoplasm of unspecified kidney, except renal pelvis: Secondary | ICD-10-CM

## 2014-10-13 DIAGNOSIS — I10 Essential (primary) hypertension: Secondary | ICD-10-CM | POA: Insufficient documentation

## 2014-10-13 DIAGNOSIS — Z85528 Personal history of other malignant neoplasm of kidney: Secondary | ICD-10-CM | POA: Insufficient documentation

## 2015-01-16 ENCOUNTER — Telehealth: Payer: Self-pay | Admitting: Family Medicine

## 2015-01-16 NOTE — Telephone Encounter (Signed)
Electronic refill request. Last office visit:   12/22/13.  Last Filled:    90 tablet 3 12/22/2013  No upcoming appts scheduled.  Please advise.

## 2015-01-17 NOTE — Telephone Encounter (Signed)
Left message asking pt to call office  °

## 2015-01-17 NOTE — Telephone Encounter (Signed)
Patient called back and scheduled physical appointment on 03/29/15 and lab appointment on 03/22/15.

## 2015-01-17 NOTE — Telephone Encounter (Signed)
Due for CPE with labs ahead of time.   Please schedule.  Sent.  Thanks.

## 2015-01-17 NOTE — Telephone Encounter (Signed)
Please schedule appointments as instructed. 

## 2015-03-18 ENCOUNTER — Other Ambulatory Visit: Payer: Self-pay | Admitting: Family Medicine

## 2015-03-18 DIAGNOSIS — E785 Hyperlipidemia, unspecified: Secondary | ICD-10-CM

## 2015-03-22 ENCOUNTER — Other Ambulatory Visit (INDEPENDENT_AMBULATORY_CARE_PROVIDER_SITE_OTHER): Payer: Managed Care, Other (non HMO)

## 2015-03-22 DIAGNOSIS — E785 Hyperlipidemia, unspecified: Secondary | ICD-10-CM | POA: Diagnosis not present

## 2015-03-22 LAB — COMPREHENSIVE METABOLIC PANEL
ALT: 18 U/L (ref 0–53)
AST: 23 U/L (ref 0–37)
Albumin: 4 g/dL (ref 3.5–5.2)
Alkaline Phosphatase: 70 U/L (ref 39–117)
BILIRUBIN TOTAL: 0.5 mg/dL (ref 0.2–1.2)
BUN: 24 mg/dL — ABNORMAL HIGH (ref 6–23)
CO2: 30 meq/L (ref 19–32)
CREATININE: 1.27 mg/dL (ref 0.40–1.50)
Calcium: 9.3 mg/dL (ref 8.4–10.5)
Chloride: 106 mEq/L (ref 96–112)
GFR: 64.22 mL/min (ref >60.00)
GLUCOSE: 110 mg/dL — AB (ref 70–99)
Potassium: 4.4 mEq/L (ref 3.5–5.1)
Sodium: 142 mEq/L (ref 135–145)
Total Protein: 6.9 g/dL (ref 6.0–8.3)

## 2015-03-22 LAB — LIPID PANEL
Cholesterol: 145 mg/dL (ref 0–200)
HDL: 28 mg/dL — AB (ref >39.00)
NONHDL: 117.24
Total CHOL/HDL Ratio: 5
Triglycerides: 278 mg/dL — ABNORMAL HIGH (ref 0.0–149.0)
VLDL: 55.6 mg/dL — ABNORMAL HIGH (ref 0.0–40.0)

## 2015-03-22 LAB — LDL CHOLESTEROL, DIRECT: LDL DIRECT: 70 mg/dL

## 2015-03-29 ENCOUNTER — Encounter: Payer: Self-pay | Admitting: Family Medicine

## 2015-03-29 ENCOUNTER — Ambulatory Visit (INDEPENDENT_AMBULATORY_CARE_PROVIDER_SITE_OTHER): Payer: Managed Care, Other (non HMO) | Admitting: Family Medicine

## 2015-03-29 VITALS — BP 126/86 | HR 77 | Temp 98.4°F | Wt 213.2 lb

## 2015-03-29 DIAGNOSIS — Z23 Encounter for immunization: Secondary | ICD-10-CM

## 2015-03-29 DIAGNOSIS — Z0000 Encounter for general adult medical examination without abnormal findings: Secondary | ICD-10-CM | POA: Diagnosis not present

## 2015-03-29 DIAGNOSIS — I10 Essential (primary) hypertension: Secondary | ICD-10-CM

## 2015-03-29 DIAGNOSIS — C642 Malignant neoplasm of left kidney, except renal pelvis: Secondary | ICD-10-CM

## 2015-03-29 MED ORDER — NIACIN ER (ANTIHYPERLIPIDEMIC) 1000 MG PO TBCR
1000.0000 mg | EXTENDED_RELEASE_TABLET | Freq: Every day | ORAL | Status: DC
Start: 2015-03-29 — End: 2016-07-09

## 2015-03-29 MED ORDER — LISINOPRIL 20 MG PO TABS: 20.0000 mg | ORAL_TABLET | Freq: Every day | ORAL | Status: DC

## 2015-03-29 NOTE — Progress Notes (Signed)
Pre visit review using our clinic review tool, if applicable. No additional management support is needed unless otherwise documented below in the visit note.  CPE- See plan.  Routine anticipatory guidance given to patient.  See health maintenance. Tetanus 2017 Flu shot today.  Shingles and PNA not due. D/w pt.  Colon and prostate cancer screening not due. D/w pt. Colon screening likely due at 37.  Living will. Wife designated if patient were incapacitated.  HIV screening d/w pt.  He can consider for the future.   Diet is good, better. Working on exercise, basketball. H/o knee trouble noted.  Knee bracing doesn't help.  Had iced prev and used ibuprofen prev.  He wants to get some relief "before it gets bad."  He wants to avoid a flare up.  He has tried tramadol prev.  D/w pt about options.    Hypertension:    Using medication without problems or lightheadedness: yes.   Chest pain with exertion:no Edema:no Short of breath:no  He has history of wax build up in the ears.  After cleaning out his ears, he had some vertigo.  Happened with position change for about 1 week, then better this week.    PMH and SH reviewed  Meds, vitals, and allergies reviewed.   ROS: See HPI.  Otherwise negative.    GEN: nad, alert and oriented HEENT: mucous membranes moist, tm and canals wnl B NECK: supple w/o LA CV: rrr. PULM: ctab, no inc wob ABD: soft, +bs EXT: no edema SKIN: no acute rash R>L knee crepitus on ROM noted.

## 2015-03-29 NOTE — Patient Instructions (Addendum)
I would try glucosamine and see if that helps.  It may be reasonable to talk to a sports med doc like [facility]. Stretch your hamstrings in the meantime.  Cut back on sugar/sweets.   Take care.  Glad to see you.

## 2015-03-30 NOTE — Assessment & Plan Note (Signed)
Per uro.

## 2015-03-30 NOTE — Assessment & Plan Note (Signed)
Tetanus 2017 Flu shot today.  Shingles and PNA not due. D/w pt.  Colon and prostate cancer screening not due. D/w pt. Colon screening likely due at 56.  Living will. Wife designated if patient were incapacitated.  HIV screening d/w pt.  He can consider for the future.   Diet is good, better. Working on exercise, basketball. H/o knee trouble noted.  Knee bracing doesn't help.  Had iced prev and used ibuprofen prev.  He wants to get some relief "before it gets bad."  He wants to avoid a flare up.  He has tried tramadol prev.  D/w pt about options.  Okay to try glucosamine and stretching, esp hamstring stretches. We may need him to see sports med clinic.  D/w pt. He agrees.

## 2015-03-30 NOTE — Assessment & Plan Note (Signed)
Controlled, Cr okay, continue work on diet and exercise, see above.  He agrees.  Labs d/w pt.   He'll work on diet for TG and sugar.

## 2015-07-06 ENCOUNTER — Encounter: Payer: Self-pay | Admitting: Gastroenterology

## 2015-10-19 ENCOUNTER — Ambulatory Visit (HOSPITAL_COMMUNITY)
Admission: RE | Admit: 2015-10-19 | Discharge: 2015-10-19 | Disposition: A | Discharge status: Home / Self Care | Payer: Managed Care, Other (non HMO) | Source: Ambulatory Visit | Attending: Urology | Admitting: Urology

## 2015-10-19 ENCOUNTER — Other Ambulatory Visit (HOSPITAL_COMMUNITY): Payer: Self-pay | Admitting: Urology

## 2015-10-19 DIAGNOSIS — Z85528 Personal history of other malignant neoplasm of kidney: Secondary | ICD-10-CM | POA: Diagnosis present

## 2015-12-04 ENCOUNTER — Other Ambulatory Visit: Payer: Self-pay | Admitting: Dermatology

## 2016-01-24 ENCOUNTER — Other Ambulatory Visit: Payer: Self-pay | Admitting: Dermatology

## 2016-05-23 ENCOUNTER — Other Ambulatory Visit: Payer: Self-pay | Admitting: Family Medicine

## 2016-05-28 ENCOUNTER — Encounter: Payer: Self-pay | Admitting: Physician Assistant

## 2016-05-28 ENCOUNTER — Ambulatory Visit (INDEPENDENT_AMBULATORY_CARE_PROVIDER_SITE_OTHER): Payer: Managed Care, Other (non HMO) | Admitting: Physician Assistant

## 2016-05-28 VITALS — BP 130/88 | HR 79 | Temp 100.7°F | Ht 72.75 in | Wt 213.0 lb

## 2016-05-28 DIAGNOSIS — J069 Acute upper respiratory infection, unspecified: Secondary | ICD-10-CM

## 2016-05-28 DIAGNOSIS — J029 Acute pharyngitis, unspecified: Secondary | ICD-10-CM | POA: Diagnosis not present

## 2016-05-28 LAB — POCT RAPID STREP A (OFFICE): RAPID STREP A SCREEN: NEGATIVE

## 2016-05-28 MED ORDER — BENZONATATE 200 MG PO CAPS: 200.0000 mg | ORAL_CAPSULE | Freq: Two times a day (BID) | ORAL | 0 refills | Status: DC | PRN

## 2016-05-28 MED ORDER — HYDROCODONE-HOMATROPINE 5-1.5 MG/5ML PO SYRP: 5.0000 mL | ORAL_SOLUTION | Freq: Every evening | ORAL | 0 refills | Status: DC | PRN

## 2016-05-28 MED ORDER — DOXYCYCLINE HYCLATE 100 MG PO TABS: 100.0000 mg | ORAL_TABLET | Freq: Two times a day (BID) | ORAL | 0 refills | Status: DC

## 2016-05-28 NOTE — Patient Instructions (Addendum)
Take the doxycycline as prescribed.  You may use the tessalon perles during the day. May take the Hycodan cough syrup at night to help you sleep, do not take while driving.  Please follow-up if your symptoms do not improve or if they worsen. You will need to be re-evaluated if worsening fever, shortness of breath or other worrisome symptoms.   Upper Respiratory Infection, Adult Most upper respiratory infections (URIs) are a viral infection of the air passages leading to the lungs. A URI affects the nose, throat, and upper air passages. The most common type of URI is nasopharyngitis and is typically referred to as "the common cold." URIs run their course and usually go away on their own. Most of the time, a URI does not require medical attention, but sometimes a bacterial infection in the upper airways can follow a viral infection. This is called a secondary infection. Sinus and middle ear infections are common types of secondary upper respiratory infections. Bacterial pneumonia can also complicate a URI. A URI can worsen asthma and chronic obstructive pulmonary disease (COPD). Sometimes, these complications can require emergency medical care and may be life threatening. What are the causes? Almost all URIs are caused by viruses. A virus is a type of germ and can spread from one person to another. What increases the risk? You may be at risk for a URI if:  You smoke.  You have chronic heart or lung disease.  You have a weakened defense (immune) system.  You are very young or very old.  You have nasal allergies or asthma.  You work in crowded or poorly ventilated areas.  You work in health care facilities or schools. What are the signs or symptoms? Symptoms typically develop 2-3 days after you come in contact with a cold virus. Most viral URIs last 7-10 days. However, viral URIs from the influenza virus (flu virus) can last 14-18 days and are typically more severe. Symptoms may  include:  Runny or stuffy (congested) nose.  Sneezing.  Cough.  Sore throat.  Headache.  Fatigue.  Fever.  Loss of appetite.  Pain in your forehead, behind your eyes, and over your cheekbones (sinus pain).  Muscle aches. How is this diagnosed? Your health care provider may diagnose a URI by:  Physical exam.  Tests to check that your symptoms are not due to another condition such as:  Strep throat.  Sinusitis.  Pneumonia.  Asthma. How is this treated? A URI goes away on its own with time. It cannot be cured with medicines, but medicines may be prescribed or recommended to relieve symptoms. Medicines may help:  Reduce your fever.  Reduce your cough.  Relieve nasal congestion. Follow these instructions at home:  Take medicines only as directed by your health care provider.  Gargle warm saltwater or take cough drops to comfort your throat as directed by your health care provider.  Use a warm mist humidifier or inhale steam from a shower to increase air moisture. This may make it easier to breathe.  Drink enough fluid to keep your urine clear or pale yellow.  Eat soups and other clear broths and maintain good nutrition.  Rest as needed.  Return to work when your temperature has returned to normal or as your health care provider advises. You may need to stay home longer to avoid infecting others. You can also use a face mask and careful hand washing to prevent spread of the virus.  Increase the usage of your inhaler if you  have asthma.  Do not use any tobacco products, including cigarettes, chewing tobacco, or electronic cigarettes. If you need help quitting, ask your health care provider. How is this prevented? The best way to protect yourself from getting a cold is to practice good hygiene.  Avoid oral or hand contact with people with cold symptoms.  Wash your hands often if contact occurs. There is no clear evidence that vitamin C, vitamin E,  echinacea, or exercise reduces the chance of developing a cold. However, it is always recommended to get plenty of rest, exercise, and practice good nutrition. Contact a health care provider if:  You are getting worse rather than better.  Your symptoms are not controlled by medicine.  You have chills.  You have worsening shortness of breath.  You have brown or red mucus.  You have yellow or brown nasal discharge.  You have pain in your face, especially when you bend forward.  You have a fever.  You have swollen neck glands.  You have pain while swallowing.  You have white areas in the back of your throat. Get help right away if:  You have severe or persistent:  Headache.  Ear pain.  Sinus pain.  Chest pain.  You have chronic lung disease and any of the following:  Wheezing.  Prolonged cough.  Coughing up blood.  A change in your usual mucus.  You have a stiff neck.  You have changes in your:  Vision.  Hearing.  Thinking.  Mood. This information is not intended to replace advice given to you by your health care provider. Make sure you discuss any questions you have with your health care provider. Document Released: 08/06/2000 Document Revised: 10/14/2015 Document Reviewed: 05/18/2013 Elsevier Interactive Patient Education  2017 [employer].

## 2016-05-28 NOTE — Progress Notes (Signed)
Parker Ryan is a 51 y.o. male here for a new problem.  History of Present Illness:   Chief Complaint  Patient presents with  . Sore Throat    started Saturday  . Cough  . Chills  . Nasal Congestion    HPI   Has a history of seasonal allergies. Felt feverish yesterday, low-grade fever today. Chills. Cough is dry and painful. Denies recent travel in flights or mountains. Does have sore throat. Denies sick exposures. Did receive a flu shot last year. Cough drops. Is having some diarrhea and variable appetite. Staying hydrated the best he can.  PMHx, SurgHx, SocialHx, Medications, and Allergies were reviewed in the Visit Navigator and updated as appropriate.  Current Medications:   Current Outpatient Prescriptions:  .  fexofenadine (ALLEGRA) 180 MG tablet, Take 180 mg by mouth daily., Disp: , Rfl:  .  aspirin 81 MG tablet, Take 81 mg by mouth daily., Disp: , Rfl:  .  benzonatate (TESSALON) 200 MG capsule, Take 1 capsule (200 mg total) by mouth 2 (two) times daily as needed for cough., Disp: 20 capsule, Rfl: 0 .  clobetasol (TEMOVATE) 0.05 % external solution, APPLY TO THE SCALP ONCE OR TWICE D PRN, Disp: , Rfl: 6 .  doxycycline (VIBRA-TABS) 100 MG tablet, Take 1 tablet (100 mg total) by mouth 2 (two) times daily., Disp: 20 tablet, Rfl: 0 .  fluocinonide (LIDEX) 0.05 % external solution, APPLY AA ONCE DAILY UTD, Disp: , Rfl: 3 .  HYDROcodone-homatropine (HYCODAN) 5-1.5 MG/5ML syrup, Take 5 mLs by mouth at bedtime as needed for cough., Disp: 120 mL, Rfl: 0 .  lisinopril (PRINIVIL,ZESTRIL) 20 MG tablet, TAKE 1 TABLET(20 MG) BY MOUTH DAILY, Disp: 15 tablet, Rfl: 0 .  Multiple Vitamin (MULTIVITAMIN) tablet, Take 1 tablet by mouth daily., Disp: , Rfl:  .  niacin (NIASPAN) 1000 MG CR tablet, Take 1 tablet (1,000 mg total) by mouth at bedtime., Disp: 90 tablet, Rfl: 3   Review of Systems:   Review of Systems  Constitutional: Positive for chills and malaise/fatigue.  HENT: Positive for  congestion and sore throat.   Respiratory: Positive for cough. Negative for sputum production and shortness of breath.   Cardiovascular: Negative for chest pain and palpitations.  Gastrointestinal: Positive for diarrhea.       Upset stomach from post nasal drip  Musculoskeletal: Positive for myalgias.  Neurological: Negative.  Negative for dizziness and headaches.    Vitals:   Vitals:   05/28/16 1133  BP: 130/88  Pulse: 79  Temp: (!) 100.7 F (38.2 C)  TempSrc: Oral  SpO2: 96%  Weight: 213 lb (96.6 kg)  Height: 6' 0.75" (1.848 m)     Body mass index is 28.3 kg/m.  Physical Exam:   Physical Exam  Constitutional: He appears well-developed. He is cooperative.  Non-toxic appearance. He does not have a sickly appearance. He does not appear ill. No distress.  HENT:  Head: Normocephalic and atraumatic.  Right Ear: External ear normal.  Left Ear: External ear normal.  Nose: Nose normal. Right sinus exhibits no maxillary sinus tenderness and no frontal sinus tenderness. Left sinus exhibits no maxillary sinus tenderness and no frontal sinus tenderness.  Mouth/Throat: Uvula is midline. Posterior oropharyngeal edema and posterior oropharyngeal erythema present.  Bilateral ears with cerumen impaction, unable to visualize TM  Eyes: Conjunctivae and lids are normal.  Neck: Trachea normal.  Cardiovascular: Normal rate, regular rhythm, S1 normal, S2 normal and normal heart sounds.   Pulmonary/Chest: Effort normal and breath  sounds normal. He has no decreased breath sounds. He has no wheezes. He has no rhonchi. He has no rales.  Lymphadenopathy:    He has no cervical adenopathy.  Neurological: He is alert.  Skin: Skin is warm, dry and intact.  Psychiatric: He has a normal mood and affect. His speech is normal and behavior is normal.  Nursing note and vitals reviewed.   Rapid strep test negative  Assessment and Plan:    Parker Ryan was seen today for sore throat, cough, chills and nasal  congestion.  Diagnoses and all orders for this visit:  Upper respiratory tract infection, unspecified type -     POCT rapid strep A  Sore throat -     POCT rapid strep A  Other orders -     benzonatate (TESSALON) 200 MG capsule; Take 1 capsule (200 mg total) by mouth 2 (two) times daily as needed for cough. -     HYDROcodone-homatropine (HYCODAN) 5-1.5 MG/5ML syrup; Take 5 mLs by mouth at bedtime as needed for cough. -     doxycycline (VIBRA-TABS) 100 MG tablet; Take 1 tablet (100 mg total) by mouth 2 (two) times daily.   Doxycycline per orders. He requested cough medicine that he can take during the day and at night so I have sent in Parker Ryan and Parker Ryan for patient. Rest and push fluids. Advised patient as follows: Please follow-up if your symptoms do not improve or if they worsen. You will need to be re-evaluated if worsening fever, shortness of breath, dizziness or other worrisome symptoms.   . Reviewed expectations re: course of current medical issues. . Discussed self-management of symptoms. . Outlined signs and symptoms indicating need for more acute intervention. . Patient verbalized understanding and all questions were answered. . See orders for this visit as documented in the electronic medical record. . Patient received an After-Visit Summary.   Inda Coke, PA-C

## 2016-07-03 ENCOUNTER — Encounter: Payer: Managed Care, Other (non HMO) | Admitting: Family Medicine

## 2016-07-03 ENCOUNTER — Other Ambulatory Visit: Payer: Self-pay

## 2016-07-03 MED ORDER — LISINOPRIL 20 MG PO TABS: ORAL_TABLET | ORAL | 0 refills | Status: DC

## 2016-07-03 NOTE — Telephone Encounter (Signed)
Pt CPX appt had to be changed to 07/09/16; last cpx 03/2015. Pt request refill # 15 of lisinopril until seen;[facility]. Advised pt done.

## 2016-07-09 ENCOUNTER — Encounter: Payer: Self-pay | Admitting: Family Medicine

## 2016-07-09 ENCOUNTER — Ambulatory Visit (INDEPENDENT_AMBULATORY_CARE_PROVIDER_SITE_OTHER): Payer: Managed Care, Other (non HMO) | Admitting: Family Medicine

## 2016-07-09 VITALS — BP 136/88 | HR 70 | Temp 98.4°F | Ht 73.0 in | Wt 210.5 lb

## 2016-07-09 DIAGNOSIS — Z0000 Encounter for general adult medical examination without abnormal findings: Secondary | ICD-10-CM

## 2016-07-09 DIAGNOSIS — I10 Essential (primary) hypertension: Secondary | ICD-10-CM

## 2016-07-09 MED ORDER — LISINOPRIL 20 MG PO TABS: ORAL_TABLET | ORAL | 3 refills | Status: DC

## 2016-07-09 MED ORDER — NIACIN ER (ANTIHYPERLIPIDEMIC) 1000 MG PO TBCR: 1000.0000 mg | EXTENDED_RELEASE_TABLET | Freq: Every day | ORAL | 3 refills | Status: DC

## 2016-07-09 NOTE — Progress Notes (Signed)
CPE- See plan.  Routine anticipatory guidance given to patient.  See health maintenance.  The possibility exists that previously documented standard health maintenance information may have been brought forward from a previous encounter into this note.  If needed, that same information has been updated to reflect the current situation based on today's encounter.    Tetanus 2017 Flu shot encouraged.  Shingles and PNA not due. D/w pt.  Colon and prostate cancer screening not due. D/w pt. Colon screening likely due at 67.  Living will. Wife designated if patient were incapacitated.  HIV screening prev d/w pt.  Diet and exercise are "not great" but he can work on that.   Hypertension:    Using medication without problems or lightheadedness: yes Chest pain with exertion:no Edema:no Short of breath:no He tried to come off ACE but BP was up on home check.    PMH and SH reviewed  Meds, vitals, and allergies reviewed.   ROS: Per HPI.  Unless specifically indicated otherwise in HPI, the patient denies:  General: fever. Eyes: acute vision changes ENT: sore throat Cardiovascular: chest pain Respiratory: SOB GI: vomiting GU: dysuria Musculoskeletal: acute back pain Derm: acute rash Neuro: acute motor dysfunction Psych: worsening mood Endocrine: polydipsia Heme: bleeding Allergy: hayfever  GEN: nad, alert and oriented HEENT: mucous membranes moist NECK: supple w/o LA CV: rrr. PULM: ctab, no inc wob ABD: soft, +bs EXT: no edema SKIN: no acute rash

## 2016-07-09 NOTE — Patient Instructions (Signed)
Don't change your meds for now.  Labs when possible, fasting.   Take care.  Glad to see you.  Update me as needed.

## 2016-07-10 NOTE — Assessment & Plan Note (Signed)
Tetanus 2017 Flu shot encouraged.  Shingles and PNA not due. D/w pt.  Colon and prostate cancer screening not due. D/w pt. Colon screening likely due at 36.  Living will. Wife designated if patient were incapacitated.  HIV screening prev d/w pt.  Diet and exercise are "not great" but he can work on that.

## 2016-07-10 NOTE — Assessment & Plan Note (Signed)
Controlled with ACE inhibitor. Continue current medication. Return for labs. Work on diet and exercise. He agrees.

## 2016-08-06 ENCOUNTER — Other Ambulatory Visit (INDEPENDENT_AMBULATORY_CARE_PROVIDER_SITE_OTHER): Payer: Managed Care, Other (non HMO)

## 2016-08-06 DIAGNOSIS — I10 Essential (primary) hypertension: Secondary | ICD-10-CM

## 2016-08-06 LAB — COMPREHENSIVE METABOLIC PANEL
ALT: 18 U/L (ref 0–53)
AST: 22 U/L (ref 0–37)
Albumin: 4.1 g/dL (ref 3.5–5.2)
Alkaline Phosphatase: 77 U/L (ref 39–117)
BUN: 26 mg/dL — AB (ref 6–23)
CHLORIDE: 106 meq/L (ref 96–112)
CO2: 28 meq/L (ref 19–32)
Calcium: 9.1 mg/dL (ref 8.4–10.5)
Creatinine, Ser: 1.32 mg/dL (ref 0.40–1.50)
GFR: 61.07 mL/min (ref >60.00)
GLUCOSE: 110 mg/dL — AB (ref 70–99)
POTASSIUM: 3.9 meq/L (ref 3.5–5.1)
SODIUM: 140 meq/L (ref 135–145)
Total Bilirubin: 0.5 mg/dL (ref 0.2–1.2)
Total Protein: 6.8 g/dL (ref 6.0–8.3)

## 2016-08-06 LAB — LIPID PANEL
CHOL/HDL RATIO: 6
Cholesterol: 152 mg/dL (ref 0–200)
HDL: 25.8 mg/dL — ABNORMAL LOW (ref >39.00)
NONHDL: 125.94
Triglycerides: 390 mg/dL — ABNORMAL HIGH (ref 0.0–149.0)
VLDL: 78 mg/dL — AB (ref 0.0–40.0)

## 2016-08-06 LAB — LDL CHOLESTEROL, DIRECT: LDL DIRECT: 64 mg/dL

## 2016-08-12 ENCOUNTER — Other Ambulatory Visit: Payer: Self-pay | Admitting: Family Medicine

## 2016-08-12 DIAGNOSIS — E785 Hyperlipidemia, unspecified: Secondary | ICD-10-CM

## 2016-08-12 MED ORDER — FENOFIBRATE 145 MG PO TABS: 145.0000 mg | ORAL_TABLET | Freq: Every day | ORAL | 3 refills | Status: DC

## 2016-10-23 ENCOUNTER — Ambulatory Visit (HOSPITAL_COMMUNITY)
Admission: RE | Admit: 2016-10-23 | Discharge: 2016-10-23 | Disposition: A | Discharge status: Home / Self Care | Payer: Managed Care, Other (non HMO) | Source: Ambulatory Visit | Attending: Urology | Admitting: Urology

## 2016-10-23 ENCOUNTER — Other Ambulatory Visit (HOSPITAL_COMMUNITY): Payer: Self-pay | Admitting: Urology

## 2016-10-23 DIAGNOSIS — Z85528 Personal history of other malignant neoplasm of kidney: Secondary | ICD-10-CM | POA: Diagnosis present

## 2016-11-13 ENCOUNTER — Other Ambulatory Visit (INDEPENDENT_AMBULATORY_CARE_PROVIDER_SITE_OTHER): Payer: Managed Care, Other (non HMO)

## 2016-11-13 DIAGNOSIS — E785 Hyperlipidemia, unspecified: Secondary | ICD-10-CM

## 2016-11-13 LAB — HEPATIC FUNCTION PANEL
ALT: 14 U/L (ref 0–53)
AST: 19 U/L (ref 0–37)
Albumin: 4.4 g/dL (ref 3.5–5.2)
Alkaline Phosphatase: 65 U/L (ref 39–117)
Bilirubin, Direct: 0.1 mg/dL (ref 0.0–0.3)
TOTAL PROTEIN: 7.4 g/dL (ref 6.0–8.3)
Total Bilirubin: 0.6 mg/dL (ref 0.2–1.2)

## 2016-11-13 LAB — LIPID PANEL
CHOL/HDL RATIO: 4
Cholesterol: 171 mg/dL (ref 0–200)
HDL: 44.1 mg/dL (ref >39.00)
LDL CALC: 91 mg/dL (ref 0–99)
NonHDL: 126.65
TRIGLYCERIDES: 180 mg/dL — AB (ref 0.0–149.0)
VLDL: 36 mg/dL (ref 0.0–40.0)

## 2016-11-14 ENCOUNTER — Encounter: Payer: Self-pay | Admitting: *Deleted

## 2016-11-21 ENCOUNTER — Other Ambulatory Visit: Payer: Self-pay | Admitting: Family Medicine

## 2017-03-25 ENCOUNTER — Encounter: Payer: Self-pay | Admitting: Gastroenterology

## 2017-08-20 ENCOUNTER — Other Ambulatory Visit: Payer: Self-pay | Admitting: Family Medicine

## 2017-08-20 ENCOUNTER — Encounter: Payer: Self-pay | Admitting: *Deleted

## 2017-08-20 DIAGNOSIS — E785 Hyperlipidemia, unspecified: Secondary | ICD-10-CM

## 2017-09-11 ENCOUNTER — Other Ambulatory Visit: Payer: Self-pay | Admitting: Family Medicine

## 2017-09-11 NOTE — Telephone Encounter (Signed)
Copied from [place] [id number]. Topic: Quick Communication - Rx Refill/Question >> Sep 11, 2017 10:03 AM Margot Ables wrote: Medication: niacin - pt is out - this was denied as pt needing cpe appt - pt is scheduled for 10/05/17 Has the patient contacted their pharmacy? Yes - denied Preferred Pharmacy (with phone number or street name): [facility], [address] [phone] (Phone) [phone] (Fax)

## 2017-09-14 MED ORDER — NIACIN ER (ANTIHYPERLIPIDEMIC) 1000 MG PO TBCR: EXTENDED_RELEASE_TABLET | ORAL | 0 refills | Status: DC

## 2017-09-14 NOTE — Telephone Encounter (Signed)
Pt. Has CPE scheduled for 10/05/17 Dr. Damita Dunnings

## 2017-09-27 ENCOUNTER — Other Ambulatory Visit: Payer: Self-pay | Admitting: Family Medicine

## 2017-09-27 DIAGNOSIS — I10 Essential (primary) hypertension: Secondary | ICD-10-CM

## 2017-09-29 ENCOUNTER — Other Ambulatory Visit (INDEPENDENT_AMBULATORY_CARE_PROVIDER_SITE_OTHER): Payer: Managed Care, Other (non HMO)

## 2017-09-29 DIAGNOSIS — I10 Essential (primary) hypertension: Secondary | ICD-10-CM

## 2017-09-29 LAB — LIPID PANEL
Cholesterol: 167 mg/dL (ref 0–200)
HDL: 33.9 mg/dL — AB (ref >39.00)
LDL Cholesterol: 98 mg/dL (ref 0–99)
NonHDL: 133.19
TRIGLYCERIDES: 174 mg/dL — AB (ref 0.0–149.0)
Total CHOL/HDL Ratio: 5
VLDL: 34.8 mg/dL (ref 0.0–40.0)

## 2017-09-29 LAB — COMPREHENSIVE METABOLIC PANEL
ALK PHOS: 56 U/L (ref 39–117)
ALT: 14 U/L (ref 0–53)
AST: 19 U/L (ref 0–37)
Albumin: 4.2 g/dL (ref 3.5–5.2)
BILIRUBIN TOTAL: 0.7 mg/dL (ref 0.2–1.2)
BUN: 21 mg/dL (ref 6–23)
CO2: 29 mEq/L (ref 19–32)
Calcium: 9.6 mg/dL (ref 8.4–10.5)
Chloride: 105 mEq/L (ref 96–112)
Creatinine, Ser: 1.49 mg/dL (ref 0.40–1.50)
GFR: 52.86 mL/min — AB (ref >60.00)
Glucose, Bld: 111 mg/dL — ABNORMAL HIGH (ref 70–99)
POTASSIUM: 4.2 meq/L (ref 3.5–5.1)
Sodium: 141 mEq/L (ref 135–145)
TOTAL PROTEIN: 7.1 g/dL (ref 6.0–8.3)

## 2017-10-05 ENCOUNTER — Encounter: Payer: Self-pay | Admitting: Family Medicine

## 2017-10-05 ENCOUNTER — Ambulatory Visit (INDEPENDENT_AMBULATORY_CARE_PROVIDER_SITE_OTHER): Payer: Managed Care, Other (non HMO) | Admitting: Family Medicine

## 2017-10-05 VITALS — BP 120/80 | HR 65 | Temp 98.3°F | Ht 72.05 in | Wt 211.2 lb

## 2017-10-05 DIAGNOSIS — I10 Essential (primary) hypertension: Secondary | ICD-10-CM | POA: Diagnosis not present

## 2017-10-05 DIAGNOSIS — Z1211 Encounter for screening for malignant neoplasm of colon: Secondary | ICD-10-CM | POA: Diagnosis not present

## 2017-10-05 DIAGNOSIS — L408 Other psoriasis: Secondary | ICD-10-CM

## 2017-10-05 DIAGNOSIS — E785 Hyperlipidemia, unspecified: Secondary | ICD-10-CM

## 2017-10-05 DIAGNOSIS — Z7189 Other specified counseling: Secondary | ICD-10-CM

## 2017-10-05 DIAGNOSIS — Z0000 Encounter for general adult medical examination without abnormal findings: Secondary | ICD-10-CM

## 2017-10-05 DIAGNOSIS — C649 Malignant neoplasm of unspecified kidney, except renal pelvis: Secondary | ICD-10-CM

## 2017-10-05 DIAGNOSIS — G43909 Migraine, unspecified, not intractable, without status migrainosus: Secondary | ICD-10-CM

## 2017-10-05 LAB — BASIC METABOLIC PANEL
BUN: 23 mg/dL (ref 6–23)
CALCIUM: 9.6 mg/dL (ref 8.4–10.5)
CO2: 29 mEq/L (ref 19–32)
CREATININE: 1.46 mg/dL (ref 0.40–1.50)
Chloride: 105 mEq/L (ref 96–112)
GFR: 54.11 mL/min — ABNORMAL LOW (ref >60.00)
Glucose, Bld: 94 mg/dL (ref 70–99)
Potassium: 4 mEq/L (ref 3.5–5.1)
Sodium: 140 mEq/L (ref 135–145)

## 2017-10-05 MED ORDER — FENOFIBRATE 145 MG PO TABS: 145.0000 mg | ORAL_TABLET | Freq: Every day | ORAL | 3 refills | Status: DC

## 2017-10-05 MED ORDER — LISINOPRIL 20 MG PO TABS: 20.0000 mg | ORAL_TABLET | Freq: Every day | ORAL | 3 refills | Status: DC

## 2017-10-05 MED ORDER — FLUOCINONIDE 0.05 % EX SOLN: CUTANEOUS | 3 refills | Status: DC

## 2017-10-05 MED ORDER — NIACIN ER (ANTIHYPERLIPIDEMIC) 1000 MG PO TBCR: EXTENDED_RELEASE_TABLET | ORAL | 3 refills | Status: DC

## 2017-10-05 MED ORDER — CLOBETASOL PROPIONATE 0.05 % EX SOLN: CUTANEOUS | 6 refills | Status: DC

## 2017-10-05 NOTE — Progress Notes (Signed)
CPE- See plan.  Routine anticipatory guidance given to patient.  See health maintenance.  The possibility exists that previously documented standard health maintenance information may have been brought forward from a previous encounter into this note.  If needed, that same information has been updated to reflect the current situation based on today's encounter.    Tetanus 2017 Flu shot prev done.   Shingles and PNA not due. D/w pt.  D/w patient GS:[id number] for colon cancer screening, including IFOB vs. colonoscopy.  Risks and benefits of both were discussed and patient voiced understanding.  Pt elects for: colonoscopy.   Prostate cancer screening and PSA options (with potential risks and benefits of testing vs not testing) were discussed along with recent recs/guidelines.  He declined testing PSA at this point. Living will. Wife designated if patient were incapacitated.  HIV screening prev d/w pt.  Diet and exercise- "some but not a whole lot."  D/w pt.    Elevated Cholesterol: Using medications without problems: yes Muscle aches: not from med.  Diet compliance: encouraged.  Exercise: encouraged  H/o rare migraines.  He has occ aura w/o HA.  This is his baseline.  One episode about 1 month ago, only 1 other in about the 6 months prior.    Hypertension:    Using medication without problems or lightheadedness: yes Chest pain with exertion:no Edema:no Short of breath:no  Psoriasis.  No ADE on med.  meds used prn topically.    Cr slightly higher on recent check.  D/w pt.  H/o renal cancer noted.  He isn't taking nsaids usually.  He only took two ibuprofen recently.    L 1st MTP pain.  Episodic pain.  No specific trauma.  D/w pt about stretching.   Rare ED, he had a few episodes with trouble a few months ago.  No sx recently.  D/w pt about options.  Home situation is good.   PMH and SH reviewed  Meds, vitals, and allergies reviewed.   ROS: Per HPI.  Unless specifically indicated  otherwise in HPI, the patient denies:  General: fever. Eyes: acute vision changes ENT: sore throat Cardiovascular: chest pain Respiratory: SOB GI: vomiting GU: dysuria Musculoskeletal: acute back pain Derm: acute rash Neuro: acute motor dysfunction Psych: worsening mood Endocrine: polydipsia Heme: bleeding Allergy: hayfever  GEN: nad, alert and oriented HEENT: mucous membranes moist NECK: supple w/o LA CV: rrr. PULM: ctab, no inc wob ABD: soft, +bs EXT: no edema SKIN: no acute rash but mild psoriatic changes noted on the shins.   Normal inspection of the left foot with normal dorsalis pedis pulse.  No first MTP erythema.  Normal range of motion at that joint.  Nontender.

## 2017-10-05 NOTE — Patient Instructions (Addendum)
We will call about your referral.  Rosaria Ferries or Azalee Course will call you if you don't see one of them on the way out.  Stretch your foot and update me as needed.   Go to the lab on the way out.  We'll contact you with your lab report. You can try stopping the aspirin.  Take care.  Glad to see you.

## 2017-10-09 ENCOUNTER — Encounter: Payer: Self-pay | Admitting: Gastroenterology

## 2017-10-09 NOTE — Assessment & Plan Note (Signed)
Cr slightly higher on recent check.  D/w pt. He isn't taking nsaids usually.  He only took two ibuprofen recently.  Recheck creatinine.  See notes on labs.

## 2017-10-09 NOTE — Assessment & Plan Note (Signed)
Continue topical treatment.  No adverse effect.  Update me as needed.  He agrees.

## 2017-10-09 NOTE — Assessment & Plan Note (Signed)
Fortunately rare symptoms.  He will update me as needed.  Continue as is.

## 2017-10-09 NOTE — Assessment & Plan Note (Signed)
Discussed with patient about diet and exercise.  No change in med.  Labs d/w pt.

## 2017-10-09 NOTE — Assessment & Plan Note (Signed)
Reasonable control.  No change in meds at this point.  See notes on follow-up creatinine.

## 2017-10-09 NOTE — Assessment & Plan Note (Addendum)
Tetanus 2017 Flu shot prev done.   Shingles and PNA not due. D/w pt.  D/w patient [id number] for colon cancer screening, including IFOB vs. colonoscopy.  Risks and benefits of both were discussed and patient voiced understanding.  Pt elects for: colonoscopy.   Prostate cancer screening and PSA options (with potential risks and benefits of testing vs not testing) were discussed along with recent recs/guidelines.  He declined testing PSA at this point. Living will. Wife designated if patient were incapacitated.  HIV screening prev d/w pt.  Diet and exercise- "some but not a whole lot."  D/w pt.    He can try stretching his first MTP and see if that helps with pain.  Update me as needed. He can update me if erectile dysfunction continues.

## 2017-10-09 NOTE — Assessment & Plan Note (Signed)
Living will.  Wife designated if patient were incapacitated.   ?

## 2017-10-28 ENCOUNTER — Other Ambulatory Visit (HOSPITAL_COMMUNITY): Payer: Self-pay | Admitting: Urology

## 2017-10-28 ENCOUNTER — Ambulatory Visit (HOSPITAL_COMMUNITY)
Admission: RE | Admit: 2017-10-28 | Discharge: 2017-10-28 | Disposition: A | Discharge status: Home / Self Care | Payer: Managed Care, Other (non HMO) | Source: Ambulatory Visit | Attending: Urology | Admitting: Urology

## 2017-10-28 DIAGNOSIS — Z85528 Personal history of other malignant neoplasm of kidney: Secondary | ICD-10-CM | POA: Diagnosis present

## 2017-11-12 ENCOUNTER — Other Ambulatory Visit: Payer: Self-pay | Admitting: Family Medicine

## 2017-11-12 DIAGNOSIS — I10 Essential (primary) hypertension: Secondary | ICD-10-CM

## 2017-11-12 NOTE — Progress Notes (Signed)
Notify patient.  Urology note reviewed.  They asked for patient to have follow-up creatinine check in 6 months.  I put in the order.  He will need a nonfasting lab visit in 6 months.  Thanks.

## 2017-11-12 NOTE — Progress Notes (Signed)
Lm on pts vm and advised. Requested pt contact office and schedule f/u labs in 27mos

## 2017-11-15 ENCOUNTER — Other Ambulatory Visit: Payer: Self-pay | Admitting: Family Medicine

## 2017-11-15 DIAGNOSIS — E785 Hyperlipidemia, unspecified: Secondary | ICD-10-CM

## 2017-11-24 ENCOUNTER — Ambulatory Visit (AMBULATORY_SURGERY_CENTER): Payer: Self-pay

## 2017-11-24 ENCOUNTER — Encounter: Payer: Self-pay | Admitting: Gastroenterology

## 2017-11-24 ENCOUNTER — Other Ambulatory Visit: Payer: Self-pay

## 2017-11-24 VITALS — Ht 73.0 in | Wt 216.6 lb

## 2017-11-24 DIAGNOSIS — Z1211 Encounter for screening for malignant neoplasm of colon: Secondary | ICD-10-CM

## 2017-11-24 MED ORDER — NA SULFATE-K SULFATE-MG SULF 17.5-3.13-1.6 GM/177ML PO SOLN: 1.0000 | Freq: Once | ORAL | 0 refills | Status: AC

## 2017-11-24 NOTE — Progress Notes (Signed)
No egg or soy allergy known to patient  No issues with past sedation with any surgeries  or procedures, no intubation problems  No diet pills per patient No home 02 use per patient  No blood thinners per patient  Pt denies issues with constipation  No A fib or A flutter  EMMI video sent to pt's e mail  

## 2017-12-04 ENCOUNTER — Ambulatory Visit (AMBULATORY_SURGERY_CENTER): Payer: Managed Care, Other (non HMO) | Admitting: Gastroenterology

## 2017-12-04 ENCOUNTER — Encounter: Payer: Self-pay | Admitting: Gastroenterology

## 2017-12-04 VITALS — BP 110/69 | HR 74 | Temp 98.6°F | Resp 10 | Ht 72.5 in | Wt 211.0 lb

## 2017-12-04 DIAGNOSIS — Z1211 Encounter for screening for malignant neoplasm of colon: Secondary | ICD-10-CM | POA: Diagnosis not present

## 2017-12-04 DIAGNOSIS — D122 Benign neoplasm of ascending colon: Secondary | ICD-10-CM | POA: Diagnosis not present

## 2017-12-04 DIAGNOSIS — D120 Benign neoplasm of cecum: Secondary | ICD-10-CM | POA: Diagnosis not present

## 2017-12-04 MED ORDER — SODIUM CHLORIDE 0.9 % IV SOLN: 500.0000 mL | Freq: Once | INTRAVENOUS | Status: DC

## 2017-12-04 NOTE — Progress Notes (Signed)
Called to room to assist during endoscopic procedure.  Patient ID and intended procedure confirmed with present staff. Received instructions for my participation in the procedure from the performing physician.  

## 2017-12-04 NOTE — Patient Instructions (Signed)
YOU HAD AN ENDOSCOPIC PROCEDURE TODAY AT THE [facility]:   Refer to the procedure report that was given to you for any specific questions about what was found during the examination.  If the procedure report does not answer your questions, please call your gastroenterologist to clarify.  If you requested that your care partner not be given the details of your procedure findings, then the procedure report has been included in a sealed envelope for you to review at your convenience later.  YOU SHOULD EXPECT: Some feelings of bloating in the abdomen. Passage of more gas than usual.  Walking can help get rid of the air that was put into your GI tract during the procedure and reduce the bloating. If you had a lower endoscopy (such as a colonoscopy or flexible sigmoidoscopy) you may notice spotting of blood in your stool or on the toilet paper. If you underwent a bowel prep for your procedure, you may not have a normal bowel movement for a few days.  Please Note:  You might notice some irritation and congestion in your nose or some drainage.  This is from the oxygen used during your procedure.  There is no need for concern and it should clear up in a day or so.  SYMPTOMS TO REPORT IMMEDIATELY:   Following lower endoscopy (colonoscopy or flexible sigmoidoscopy):  Excessive amounts of blood in the stool  Significant tenderness or worsening of abdominal pains  Swelling of the abdomen that is new, acute  Fever of 100F or higher  For urgent or emergent issues, a gastroenterologist can be reached at any hour by calling [phone].   DIET:  We do recommend a small meal at first, but then you may proceed to your regular diet.  Drink plenty of fluids but you should avoid alcoholic beverages for 24 hours.  ACTIVITY:  You should plan to take it easy for the rest of today and you should NOT DRIVE or use heavy machinery until tomorrow (because of the sedation medicines used during the test).     FOLLOW UP: Our staff will call the number listed on your records the next business day following your procedure to check on you and address any questions or concerns that you may have regarding the information given to you following your procedure. If we do not reach you, we will leave a message.  However, if you are feeling well and you are not experiencing any problems, there is no need to return our call.  We will assume that you have returned to your regular daily activities without incident.  If any biopsies were taken you will be contacted by phone or by letter within the next 1-3 weeks.  Please call us at [phone] if you have not heard about the biopsies in 3 weeks.   Await for biopsy results Polyps (handout given) Diverticulosis (handout given)  SIGNATURES/CONFIDENTIALITY: You and/or your care partner have signed paperwork which will be entered into your electronic medical record.  These signatures attest to the fact that that the information above on your After Visit Summary has been reviewed and is understood.  Full responsibility of the confidentiality of this discharge information lies with you and/or your care-partner. 

## 2017-12-04 NOTE — Op Note (Signed)
[facility] Patient Name: Parker Ryan Procedure Date: 12/04/2017 2:35 PM MRN: [id number] Endoscopist: Remo Lipps P. Havery Moros , MD Age: 52 Referring MD:  Date of Birth: 14-May-1965 Gender: Male Account #: [id number] Procedure:                Colonoscopy Indications:              Screening for colorectal malignant neoplasm Medicines:                Monitored Anesthesia Care Procedure:                Pre-Anesthesia Assessment:                           - Prior to the procedure, a History and Physical                            was performed, and patient medications and                            allergies were reviewed. The patient's tolerance of                            previous anesthesia was also reviewed. The risks                            and benefits of the procedure and the sedation                            options and risks were discussed with the patient.                            All questions were answered, and informed consent                            was obtained. Prior Anticoagulants: The patient has                            taken no previous anticoagulant or antiplatelet                            agents. ASA Grade Assessment: II - A patient with                            mild systemic disease. After reviewing the risks                            and benefits, the patient was deemed in                            satisfactory condition to undergo the procedure.                           After obtaining informed consent, the colonoscope  was passed under direct vision. Throughout the                            procedure, the patient's blood pressure, pulse, and                            oxygen saturations were monitored continuously. The                            Colonoscope was introduced through the anus and                            advanced to the the cecum, identified by                            appendiceal orifice  and ileocecal valve. The                            colonoscopy was performed without difficulty. The                            patient tolerated the procedure well. The quality                            of the bowel preparation was adequate. The                            ileocecal valve, appendiceal orifice, and rectum                            were photographed. Scope In: 2:45:10 PM Scope Out: 3:00:02 PM Scope Withdrawal Time: 0 hours 13 minutes 15 seconds  Total Procedure Duration: 0 hours 14 minutes 52 seconds  Findings:                 The perianal and digital rectal examinations were                            normal.                           A 6 to 7 mm polyp was found in the ileocecal valve.                            The polyp was sessile. The polyp was removed with a                            cold snare. Resection and retrieval were complete.                           A 3 mm polyp was found in the ascending colon. The                            polyp was sessile. The polyp was removed with  a                            cold snare. Resection and retrieval were complete.                           Multiple small-mouthed diverticula were found in                            the sigmoid colon.                           The exam was otherwise without abnormality. Complications:            No immediate complications. Estimated blood loss:                            Minimal. Estimated Blood Loss:     Estimated blood loss was minimal. Impression:               - One 6 to 7 mm polyp at the ileocecal valve,                            removed with a cold snare. Resected and retrieved.                           - One 3 mm polyp in the ascending colon, removed                            with a cold snare. Resected and retrieved.                           - Diverticulosis in the sigmoid colon.                           - The examination was otherwise normal. Recommendation:           -  Patient has a contact number available for                            emergencies. The signs and symptoms of potential                            delayed complications were discussed with the                            patient. Return to normal activities tomorrow.                            Written discharge instructions were provided to the                            patient.                           - Resume previous diet.                           -  Continue present medications.                           - Await pathology results.                           - Repeat colonoscopy for surveillance based on                            pathology results. Remo Lipps P. Armbruster, MD 12/04/2017 3:03:13 PM This report has been signed electronically.

## 2017-12-04 NOTE — Progress Notes (Signed)
Pt's states no medical or surgical changes since previsit or office visit. 

## 2017-12-04 NOTE — Progress Notes (Signed)
A/ox3, pleased with MAC, report to RN 

## 2017-12-07 ENCOUNTER — Telehealth: Payer: Self-pay

## 2017-12-07 NOTE — Telephone Encounter (Signed)
  Follow up Call-  Call back number 12/04/2017  Post procedure Call Back phone  # [phone]  Permission to leave phone message Yes  Some recent data might be hidden     Patient questions:  Do you have a fever, pain , or abdominal swelling? No. Pain Score  0 *  Have you tolerated food without any problems? Yes.    Have you been able to return to your normal activities? Yes.    Do you have any questions about your discharge instructions: Diet   No. Medications  No. Follow up visit  No.  Do you have questions or concerns about your Care? No.  Actions: * If pain score is 4 or above: No action needed, pain <4.

## 2017-12-11 ENCOUNTER — Encounter: Payer: Managed Care, Other (non HMO) | Admitting: Gastroenterology

## 2017-12-14 ENCOUNTER — Other Ambulatory Visit: Payer: Self-pay | Admitting: Family Medicine

## 2017-12-16 ENCOUNTER — Encounter: Payer: Self-pay | Admitting: Gastroenterology

## 2018-01-22 ENCOUNTER — Other Ambulatory Visit: Payer: Self-pay | Admitting: Family Medicine

## 2018-01-22 DIAGNOSIS — E785 Hyperlipidemia, unspecified: Secondary | ICD-10-CM

## 2018-03-14 ENCOUNTER — Other Ambulatory Visit: Payer: Self-pay | Admitting: Family Medicine

## 2018-05-13 ENCOUNTER — Other Ambulatory Visit (INDEPENDENT_AMBULATORY_CARE_PROVIDER_SITE_OTHER): Payer: Managed Care, Other (non HMO)

## 2018-05-13 ENCOUNTER — Other Ambulatory Visit: Payer: Self-pay

## 2018-05-13 DIAGNOSIS — I10 Essential (primary) hypertension: Secondary | ICD-10-CM

## 2018-05-13 LAB — BASIC METABOLIC PANEL
BUN: 26 mg/dL — ABNORMAL HIGH (ref 6–23)
CO2: 29 mEq/L (ref 19–32)
CREATININE: 1.42 mg/dL (ref 0.40–1.50)
Calcium: 9.7 mg/dL (ref 8.4–10.5)
Chloride: 102 mEq/L (ref 96–112)
GFR: 52.44 mL/min — AB (ref >60.00)
GLUCOSE: 96 mg/dL (ref 70–99)
Potassium: 4.2 mEq/L (ref 3.5–5.1)
SODIUM: 138 meq/L (ref 135–145)

## 2018-10-05 ENCOUNTER — Other Ambulatory Visit: Payer: Self-pay | Admitting: Family Medicine

## 2018-10-06 NOTE — Telephone Encounter (Signed)
LOV 10/05/2017. Last filled on 10/05/2017. No future appointments made.

## 2018-10-06 NOTE — Telephone Encounter (Signed)
Sent.  Needs CPE this fall when possible.  Thanks.

## 2018-10-07 NOTE — Telephone Encounter (Signed)
Patient advised and appointments made

## 2018-11-18 ENCOUNTER — Other Ambulatory Visit (INDEPENDENT_AMBULATORY_CARE_PROVIDER_SITE_OTHER): Payer: Managed Care, Other (non HMO)

## 2018-11-18 ENCOUNTER — Other Ambulatory Visit: Payer: Self-pay | Admitting: Family Medicine

## 2018-11-18 ENCOUNTER — Other Ambulatory Visit: Payer: Self-pay

## 2018-11-18 DIAGNOSIS — I10 Essential (primary) hypertension: Secondary | ICD-10-CM

## 2018-11-18 LAB — COMPREHENSIVE METABOLIC PANEL
ALT: 12 U/L (ref 0–53)
AST: 16 U/L (ref 0–37)
Albumin: 4.3 g/dL (ref 3.5–5.2)
Alkaline Phosphatase: 67 U/L (ref 39–117)
BUN: 21 mg/dL (ref 6–23)
CO2: 28 mEq/L (ref 19–32)
Calcium: 9.5 mg/dL (ref 8.4–10.5)
Chloride: 105 mEq/L (ref 96–112)
Creatinine, Ser: 1.33 mg/dL (ref 0.40–1.50)
GFR: 56.44 mL/min — ABNORMAL LOW (ref >60.00)
Glucose, Bld: 103 mg/dL — ABNORMAL HIGH (ref 70–99)
Potassium: 4.5 mEq/L (ref 3.5–5.1)
Sodium: 140 mEq/L (ref 135–145)
Total Bilirubin: 0.4 mg/dL (ref 0.2–1.2)
Total Protein: 6.8 g/dL (ref 6.0–8.3)

## 2018-11-18 LAB — LIPID PANEL
Cholesterol: 147 mg/dL (ref 0–200)
HDL: 31.6 mg/dL — ABNORMAL LOW (ref >39.00)
LDL Cholesterol: 88 mg/dL (ref 0–99)
NonHDL: 115.62
Total CHOL/HDL Ratio: 5
Triglycerides: 138 mg/dL (ref 0.0–149.0)
VLDL: 27.6 mg/dL (ref 0.0–40.0)

## 2018-11-22 ENCOUNTER — Other Ambulatory Visit: Payer: Managed Care, Other (non HMO)

## 2018-11-25 ENCOUNTER — Other Ambulatory Visit (HOSPITAL_COMMUNITY): Payer: Self-pay | Admitting: Urology

## 2018-11-25 ENCOUNTER — Encounter: Payer: Self-pay | Admitting: Family Medicine

## 2018-11-25 ENCOUNTER — Ambulatory Visit (INDEPENDENT_AMBULATORY_CARE_PROVIDER_SITE_OTHER): Payer: Managed Care, Other (non HMO) | Admitting: Family Medicine

## 2018-11-25 ENCOUNTER — Other Ambulatory Visit: Payer: Self-pay

## 2018-11-25 ENCOUNTER — Ambulatory Visit (HOSPITAL_COMMUNITY)
Admission: RE | Admit: 2018-11-25 | Discharge: 2018-11-25 | Disposition: A | Discharge status: Home / Self Care | Payer: Managed Care, Other (non HMO) | Source: Ambulatory Visit | Attending: Urology | Admitting: Urology

## 2018-11-25 VITALS — BP 122/78 | HR 75 | Temp 97.7°F | Ht 72.5 in | Wt 215.4 lb

## 2018-11-25 DIAGNOSIS — I10 Essential (primary) hypertension: Secondary | ICD-10-CM

## 2018-11-25 DIAGNOSIS — C649 Malignant neoplasm of unspecified kidney, except renal pelvis: Secondary | ICD-10-CM

## 2018-11-25 DIAGNOSIS — E785 Hyperlipidemia, unspecified: Secondary | ICD-10-CM

## 2018-11-25 DIAGNOSIS — Z23 Encounter for immunization: Secondary | ICD-10-CM | POA: Diagnosis not present

## 2018-11-25 DIAGNOSIS — Z0000 Encounter for general adult medical examination without abnormal findings: Secondary | ICD-10-CM | POA: Diagnosis not present

## 2018-11-25 DIAGNOSIS — Z85528 Personal history of other malignant neoplasm of kidney: Secondary | ICD-10-CM

## 2018-11-25 DIAGNOSIS — Z7189 Other specified counseling: Secondary | ICD-10-CM

## 2018-11-25 MED ORDER — NIACIN ER (ANTIHYPERLIPIDEMIC) 1000 MG PO TBCR: EXTENDED_RELEASE_TABLET | ORAL | 3 refills | Status: DC

## 2018-11-25 MED ORDER — LISINOPRIL 20 MG PO TABS: 20.0000 mg | ORAL_TABLET | Freq: Every day | ORAL | 3 refills | Status: DC

## 2018-11-25 MED ORDER — FENOFIBRATE 145 MG PO TABS: 145.0000 mg | ORAL_TABLET | Freq: Every day | ORAL | 3 refills | Status: DC

## 2018-11-25 NOTE — Progress Notes (Signed)
CPE- See plan.  Routine anticipatory guidance given to patient.  See health maintenance.  The possibility exists that previously documented standard health maintenance information may have been brought forward from a previous encounter into this note.  If needed, that same information has been updated to reflect the current situation based on today's encounter.    Tetanus 2017 Flu shot 2020 Shingles and PNA not due. D/w pt.  Colonoscopy 2019 Prostate cancer screening and PSA options (with potential risks and benefits of testing vs not testing) were discussed along with recent recs/guidelines.  He declined testing PSA at this point.   Living will. Wife designated if patient were incapacitated.  HIV screening prev d/w pt.  Diet and exercise d/w pt.    Hypertension:    Using medication without problems or lightheadedness: yes Chest pain with exertion:no Edema:no Short of breath:no Labs d/w pt.    Elevated Cholesterol: Using medications without problems: yes Muscle aches: rare, d/w pt about adequate hydration.   Diet compliance: yes Exercise: encouraged.  D/w pt.   RCC hx per urology.   I'll defer.  He agrees.  No hematuria.    PMH and SH reviewed  Meds, vitals, and allergies reviewed.   ROS: Per HPI.  Unless specifically indicated otherwise in HPI, the patient denies:  General: fever. Eyes: acute vision changes ENT: sore throat Cardiovascular: chest pain Respiratory: SOB GI: vomiting GU: dysuria Musculoskeletal: acute back pain Derm: acute rash Neuro: acute motor dysfunction Psych: worsening mood Endocrine: polydipsia Heme: bleeding Allergy: hayfever  GEN: nad, alert and oriented HEENT: ncat NECK: supple w/o LA CV: rrr. PULM: ctab, no inc wob ABD: soft, +bs EXT: no edema SKIN: no acute rash

## 2018-11-25 NOTE — Patient Instructions (Signed)
Thanks for getting a flu shot.  Update me as needed.  Take care.  Glad to see you.

## 2018-11-28 NOTE — Assessment & Plan Note (Signed)
Tetanus 2017 Flu shot 2020 Shingles and PNA not due. D/w pt.  Colonoscopy 2019 Prostate cancer screening and PSA options (with potential risks and benefits of testing vs not testing) were discussed along with recent recs/guidelines.  He declined testing PSA at this point.   Living will. Wife designated if patient were incapacitated.  HIV screening prev d/w pt.  Diet and exercise d/w pt.

## 2018-11-28 NOTE — Assessment & Plan Note (Signed)
No change in meds.  Labs discussed with patient.  Continue work on diet and exercise.  He agrees.

## 2018-11-28 NOTE — Assessment & Plan Note (Signed)
RCC hx per urology.   I'll defer.  He agrees.  No hematuria.

## 2018-11-28 NOTE — Assessment & Plan Note (Signed)
Living will.  Wife designated if patient were incapacitated.   ?

## 2019-01-18 IMAGING — CR DG CHEST 2V
2 series · 2 of 2 positions shown · non-contrast
Comparison: 10/23/2016

CLINICAL DATA: Right nephrectomy

EXAM:
CHEST - 2 VIEW

[w chest pa]
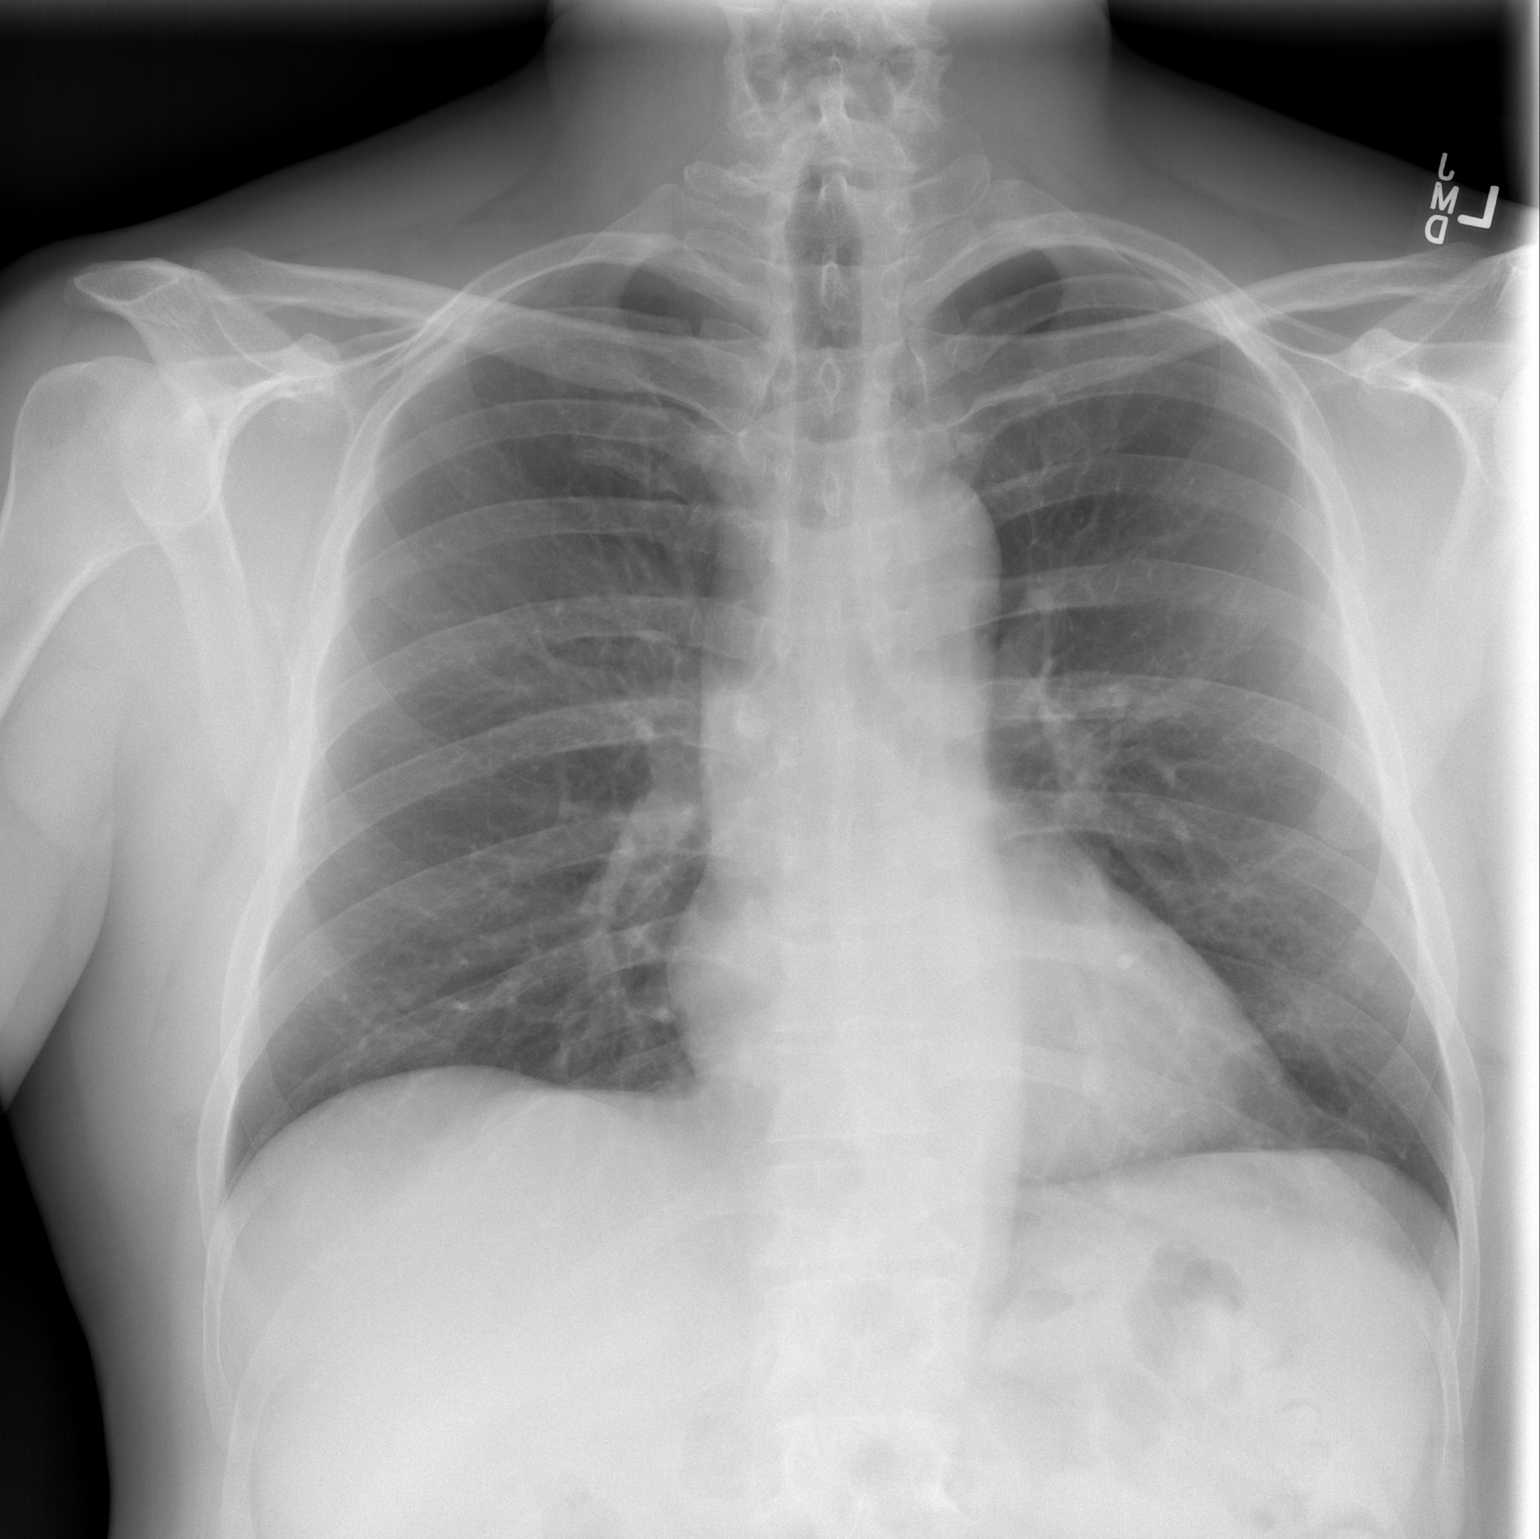

[w chest lat]
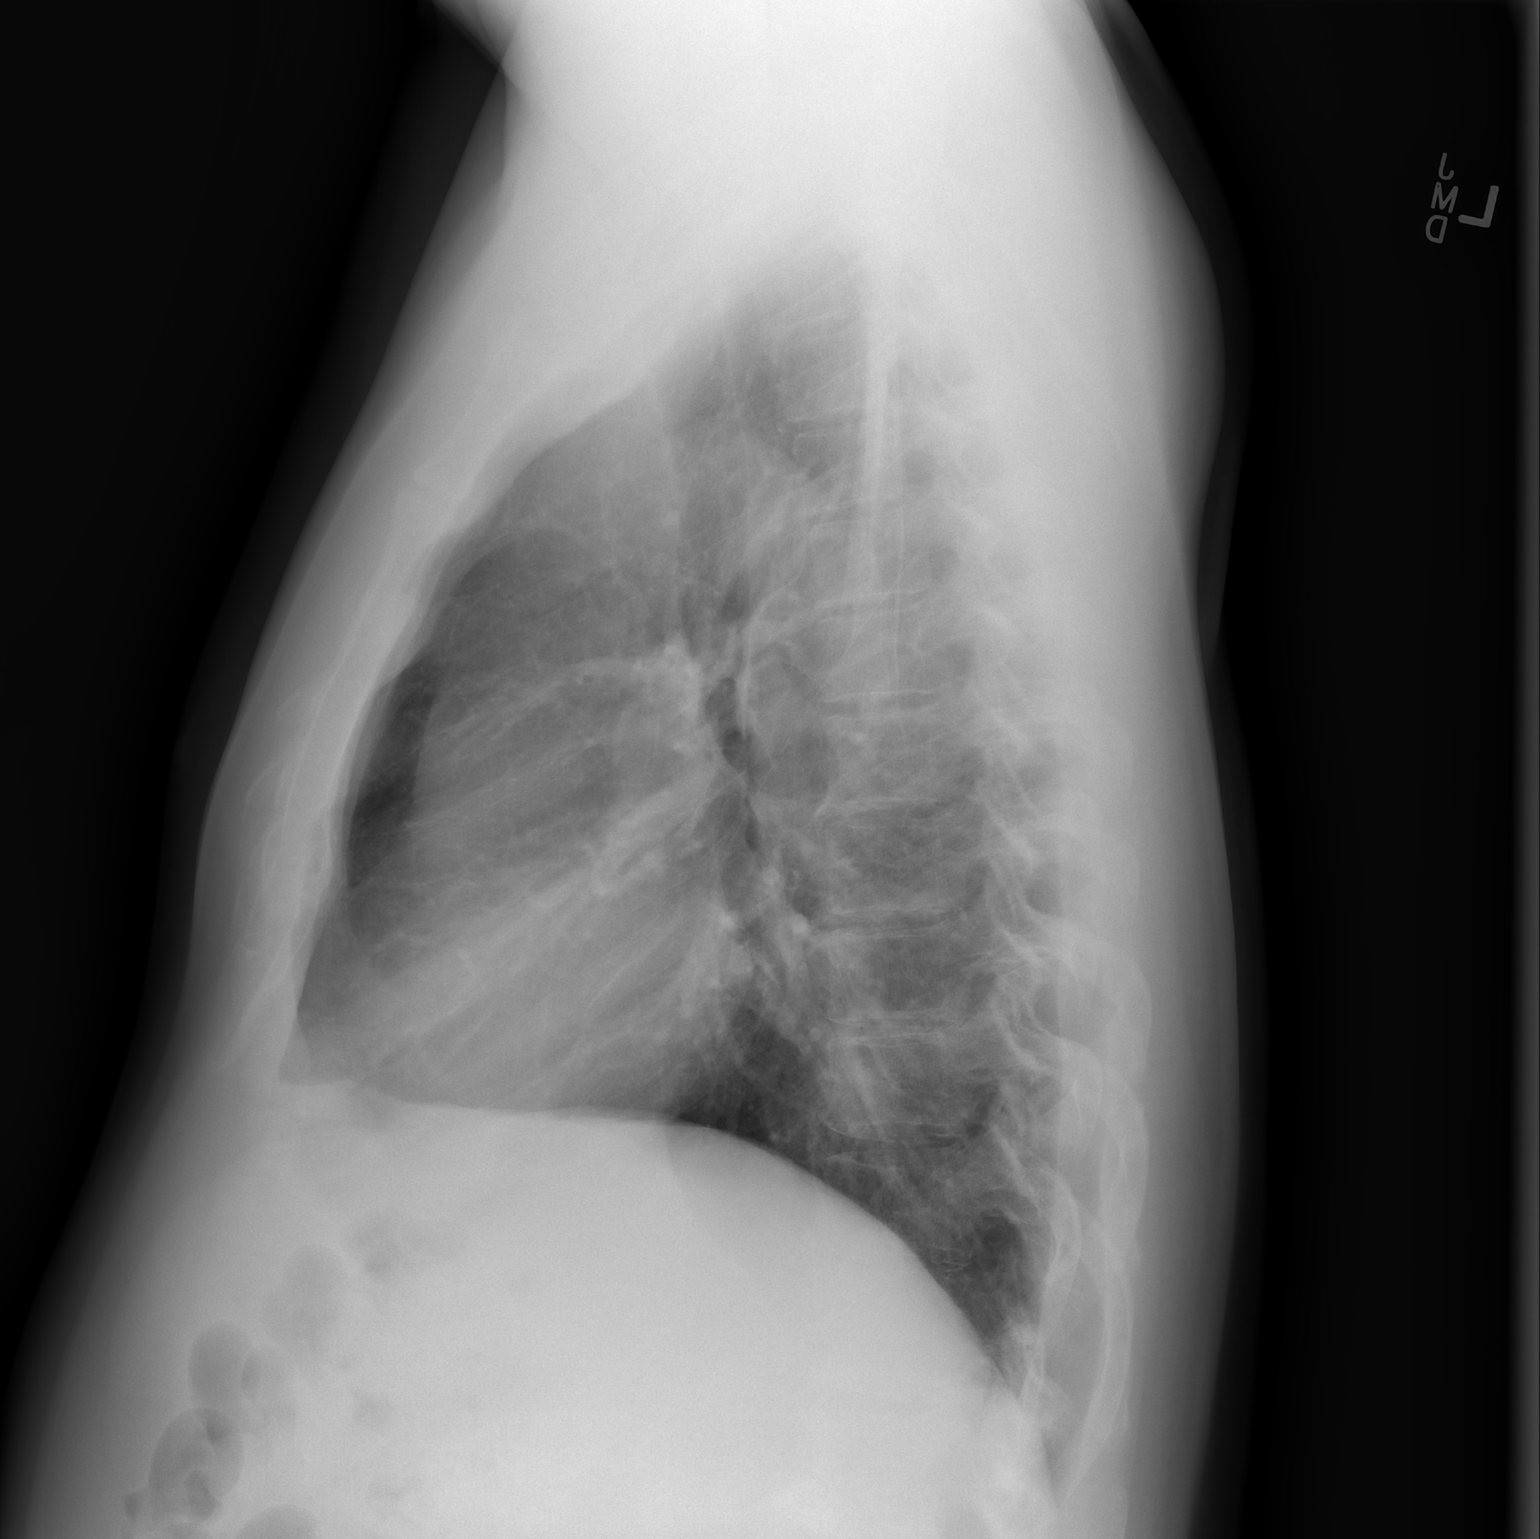

[2 of 2 positions shown; findings below may reference images not displayed]

FINDINGS: The heart size and mediastinal contours are within normal limits.
Both lungs are clear. The visualized skeletal structures are
unremarkable.
IMPRESSION: No active cardiopulmonary disease.

## 2019-05-20 ENCOUNTER — Ambulatory Visit: Payer: Managed Care, Other (non HMO) | Attending: Internal Medicine

## 2019-05-20 DIAGNOSIS — Z23 Encounter for immunization: Secondary | ICD-10-CM

## 2019-05-20 NOTE — Progress Notes (Signed)
   Covid-19 Vaccination Clinic  Name:  Parker Ryan    MRN: [id number] DOB: 07/18/1965  05/20/2019  Mr. Parker Ryan was observed post Covid-19 immunization for 15 minutes without incident. He was provided with Vaccine Information Sheet and instruction to access the V-Safe system.   Mr. Parker Ryan was instructed to call 911 with any severe reactions post vaccine: Marland Kitchen Difficulty breathing  . Swelling of face and throat  . A fast heartbeat  . A bad rash all over body  . Dizziness and weakness   Immunizations Administered    Name Date Dose VIS Date Route   Pfizer COVID-19 Vaccine 05/20/2019  3:10 PM 0.3 mL 02/04/2019 Intramuscular   Manufacturer: [place]   Lot: [id number]   [place]: [id number]

## 2019-06-08 ENCOUNTER — Telehealth: Payer: Self-pay | Admitting: Family Medicine

## 2019-06-08 NOTE — Telephone Encounter (Signed)
Virtual Visit scheduled with Dr Damita Dunnings for tomorrow at 11:00 for sinus pain only in evenings, sharp pains in sinus - tried OTC meds, no relief, not able to sleep. Has tried NyQuil, Tylenol PM, Sudafed, Claritin, or Benadryl - no relief  Pt plans to try to take the Sudafed a little later in the afternoon today to see if it will cover his symptoms later tonight. Pt is not sure if he is just missing his symptoms by treating too early. Pt has tried a steam shower which works best but is only temp relief. Pt states that he has not used any sinus rinses like Netti Pot. Pt has not tried Zyrtec.    Pt is wanting to know of any recommendations he can use tonight to help him sleep and get some relief. Aware that Dr Damita Dunnings is not in office on Wednesdays and this will be sent to the covering Provider.   Please advise, thanks.

## 2019-06-08 NOTE — Telephone Encounter (Signed)
Tried to contact pt but no answer and VM is full.  

## 2019-06-08 NOTE — Telephone Encounter (Signed)
Advised pt of PCP msg. Pt has VV tomorrow. Pt verbalized understanding.

## 2019-06-08 NOTE — Telephone Encounter (Signed)
If he feels some better with a shower then warm compresses may also offer temporary relief.  He can get a washcloth damp with warm/hot water (with caution not to get burned), and use that across his cheeks and forehead.  Reasonable to try Flonase 2 sprays per nostril per day, he can get that over-the-counter.  Trying Sudafed later on is reasonable.  Would be okay to try Nettie pot after a shower if he can get his nasal passages open enough to be useful.  I will be glad to see him tomorrow.  I hope he feels better soon.  Thanks.

## 2019-06-09 ENCOUNTER — Telehealth (INDEPENDENT_AMBULATORY_CARE_PROVIDER_SITE_OTHER): Payer: Managed Care, Other (non HMO) | Admitting: Family Medicine

## 2019-06-09 ENCOUNTER — Encounter: Payer: Self-pay | Admitting: Family Medicine

## 2019-06-09 DIAGNOSIS — J302 Other seasonal allergic rhinitis: Secondary | ICD-10-CM | POA: Diagnosis not present

## 2019-06-09 MED ORDER — PREDNISONE 10 MG PO TABS: ORAL_TABLET | ORAL | 0 refills | Status: DC

## 2019-06-09 NOTE — Progress Notes (Signed)
Virtual visit completed through WebEx or similar program Patient location: home  Provider location: Financial controller at [facility], office   Pandemic considerations d/w pt.   Limitations and rationale for visit method d/w patient.  Patient agreed to proceed.   CC: URI sx.  Sinus pain.    HPI:  H/o seasonal allergies for the last few years.  Recently with runny nose, scratchy eyes, usually mild sx.  Sx started this week.  He feels okay in the AM but as the day goes on he has more R maxillary and frontal pain.  Hot showers help.  Steam inhaler helps some.  flonase helps.    No fevers.  He does have h/o deviated septum and has restricted flow on the R side.    Meds and allergies reviewed.   ROS: Per HPI unless specifically indicated in ROS section   NAD Speech wnl  A/P: Likely seasonal allergy flare. Start prednisone with routine cautions.   Continue flonase for now.  He agrees.  Update me as needed.   Continue supportive care otherwise.

## 2019-06-11 DIAGNOSIS — J302 Other seasonal allergic rhinitis: Secondary | ICD-10-CM | POA: Insufficient documentation

## 2019-06-11 NOTE — Assessment & Plan Note (Addendum)
Likely seasonal allergy flare. Start prednisone with routine cautions.   Continue flonase for now.  He agrees.  Update me as needed.  Continue supportive care otherwise.

## 2019-06-15 ENCOUNTER — Ambulatory Visit: Payer: Managed Care, Other (non HMO) | Attending: Internal Medicine

## 2019-06-15 DIAGNOSIS — Z23 Encounter for immunization: Secondary | ICD-10-CM

## 2019-06-15 NOTE — Progress Notes (Signed)
   Covid-19 Vaccination Clinic  Name:  Parker Ryan    MRN: [id number] DOB: 08/29/1965  06/15/2019  Parker Ryan was observed post Covid-19 immunization for 15 minutes without incident. He was provided with Vaccine Information Sheet and instruction to access the V-Safe system.   Parker Ryan was instructed to call 911 with any severe reactions post vaccine: Marland Kitchen Difficulty breathing  . Swelling of face and throat  . A fast heartbeat  . A bad rash all over body  . Dizziness and weakness   Immunizations Administered    Name Date Dose VIS Date Route   Pfizer COVID-19 Vaccine 06/15/2019 12:12 PM 0.3 mL 04/20/2018 Intramuscular   Manufacturer: [place]   Lot: [id number]   [place]: [id number]

## 2019-07-05 DIAGNOSIS — J342 Deviated nasal septum: Secondary | ICD-10-CM | POA: Insufficient documentation

## 2019-07-19 ENCOUNTER — Encounter (HOSPITAL_COMMUNITY): Payer: Self-pay

## 2019-07-19 ENCOUNTER — Ambulatory Visit (HOSPITAL_COMMUNITY)
Admission: EM | Admit: 2019-07-19 | Discharge: 2019-07-19 | Disposition: A | Discharge status: Home / Self Care | Payer: Managed Care, Other (non HMO)

## 2019-07-19 ENCOUNTER — Telehealth: Payer: Self-pay

## 2019-07-19 ENCOUNTER — Other Ambulatory Visit: Payer: Self-pay

## 2019-07-19 DIAGNOSIS — I808 Phlebitis and thrombophlebitis of other sites: Secondary | ICD-10-CM

## 2019-07-19 MED ORDER — IBUPROFEN 600 MG PO TABS: 600.0000 mg | ORAL_TABLET | Freq: Four times a day (QID) | ORAL | 0 refills | Status: DC | PRN

## 2019-07-19 NOTE — ED Provider Notes (Signed)
[place]    CSN: [id number] Arrival date & time: 07/19/19  1058      History   Chief Complaint Chief Complaint  Patient presents with  . Arm Pain    HPI Parker Ryan is a 54 y.o. male.   Patient reports urgent care for evaluation of swollen painful vein in his right forearm.  He reports this started 4 to 5 days ago on Thursday.  He reports having little pain firmness of the vein on the bottom of his forearm.  Reports this persisted and the pain began to go down his forearm some.  He reports he called his primary care office and discussed this with him and they recommended he be evaluated in an urgent care emergency department as the unit available appointment.  In clinic today continues to endorse this pain in palpable vessel.  He denies hand or forearm swelling.  Denies pain above the elbow.  Denies any swelling above the elbow.  No recent injuries in the right arm.  No recent vascular catheterizations in the arm.  Neuro no numbness, tingling or weakness noted in the right arm.  He has no shoulder or thoracic surgeries.  He does have history of renal cell carcinoma and is status post partial nephrectomy 6 years ago.  He reports he has been cancer free since that time and is not undergoing any active treatments for this.  He denies any recent fevers or chills.  No shortness of breath or chest pain reported.  No personal history of DVT or family history of blood clots or clotting disorders.  Patient is a non-smoker.     Past Medical History:  Diagnosis Date  . Arthritis    right big toe  . Cancer ([place])   . H/O renal cell carcinoma   . Hyperlipemia 08/07  . Hypertension 09/06  . Migraine   . Psoriasis   . Renal stones   . Sore throat    no fever- began 10/12/12    Patient Active Problem List   Diagnosis Date Noted  . Seasonal allergies 06/11/2019  . Advance care planning 12/23/2013  . Knee pain 12/23/2013  . Motion sickness 05/12/2012  . Renal stones  02/03/2012  . Hyperglycemia 02/03/2012  . Renal cell carcinoma ([place]) 02/03/2012  . Routine general medical examination at a health care facility 06/16/2011  . Migraine headache 06/18/2009  . PHOTOPHOBIA 04/18/2009  . SNORING 04/18/2009  . Hyperlipidemia 11/03/2007  . Essential hypertension 05/12/2006  . PSORIASIS 05/12/2006    Past Surgical History:  Procedure Laterality Date  . COLONOSCOPY    . MRA Head and Neck  05/18/09   Nml  . MRI Head  05/18/09   Multip nonspecific changes  . ROBOTIC ASSITED PARTIAL NEPHRECTOMY Right 10/18/2012   Procedure: ROBOTIC ASSITED PARTIAL NEPHRECTOMY;  Surgeon: Dutch Gray, MD;  Location: [facility];  Service: Urology;  Laterality: Right;       Home Medications    Prior to Admission medications   Medication Sig Start Date End Date Taking? Authorizing Provider  fluticasone (FLONASE) 50 MCG/ACT nasal spray Place into both nostrils daily.   Yes [provider]  loratadine (CLARITIN) 10 MG tablet Take 10 mg by mouth daily.   Yes [provider]  clobetasol (TEMOVATE) 0.05 % external solution APPLY TO THE SCALP ONCE OR TWICE DAILY AS NEEDED 10/06/18   Tonia Ghent, MD  fenofibrate (TRICOR) 145 MG tablet Take 1 tablet (145 mg total) by mouth daily. 11/25/18  Tonia Ghent, MD  fexofenadine (ALLEGRA) 180 MG tablet Take 180 mg by mouth daily as needed.     [provider]  fluocinonide (LIDEX) 0.05 % external solution APPLY TO AFFECTED AREA ONCE DAILY AS DIRECTED 10/06/18   Tonia Ghent, MD  ibuprofen (ADVIL) 600 MG tablet Take 1 tablet (600 mg total) by mouth every 6 (six) hours as needed. 07/19/19   Raejean Swinford, Marguerita Beards, PA-C  lisinopril (ZESTRIL) 20 MG tablet Take 1 tablet (20 mg total) by mouth daily. 11/25/18   Tonia Ghent, MD  Multiple Vitamin (MULTIVITAMIN) tablet Take 1 tablet by mouth daily.    [provider]  niacin (NIASPAN) 1000 MG CR tablet TAKE 1 TABLET BY MOUTH AT BEDTIME. 11/25/18   Tonia Ghent, MD    predniSONE (DELTASONE) 10 MG tablet Take 2 a day for 5 days, then 1 a day for 5 days, with food. Don't take with aleve/ibuprofen. 06/09/19   Tonia Ghent, MD    Family History Family History  Problem Relation Age of Onset  . Hyperlipidemia Mother   . Heart disease Father        MI  . Hypertension Father   . Heart disease Paternal Grandfather        MI  . Alcohol abuse Other   . Diabetes Neg Hx   . Prostate cancer Neg Hx   . Colon cancer Neg Hx   . Rectal cancer Neg Hx   . Colon polyps Neg Hx     Social History Social History   Tobacco Use  . Smoking status: Never Smoker  . Smokeless tobacco: Never Used  Substance Use Topics  . Alcohol use: Yes    Comment: < 5 week, sometimes none  . Drug use: No     Allergies   Patient has no known allergies.   Review of Systems Review of Systems  Respiratory: Negative for cough and shortness of breath.   Cardiovascular: Negative for chest pain, palpitations and leg swelling.  Neurological: Negative for headaches.     Physical Exam Triage Vital Signs ED Triage Vitals  Enc Vitals Group     BP 07/19/19 1135 122/83     Pulse Rate 07/19/19 1135 74     Resp 07/19/19 1135 18     Temp 07/19/19 1135 98.1 F (36.7 C)     Temp Source 07/19/19 1135 Oral     SpO2 07/19/19 1135 98 %     Weight --      Height --      Head Circumference --      Peak Flow --      Pain Score 07/19/19 1133 4     Pain Loc --      Pain Edu? --      Excl. in [place]? --    No data found.  Updated Vital Signs BP 122/83 (BP Location: Left Arm)   Pulse 74   Temp 98.1 F (36.7 C) (Oral)   Resp 18   SpO2 98%   Visual Acuity Right Eye Distance:   Left Eye Distance:   Bilateral Distance:    Right Eye Near:   Left Eye Near:    Bilateral Near:     Physical Exam Vitals and nursing note reviewed.  Constitutional:      General: He is not in acute distress.    Appearance: He is well-developed. He is not ill-appearing.  HENT:     Head:  Normocephalic and atraumatic.  Eyes:  Conjunctiva/sclera: Conjunctivae normal.  Cardiovascular:     Rate and Rhythm: Normal rate and regular rhythm.     Heart sounds: No murmur.  Pulmonary:     Effort: Pulmonary effort is normal. No respiratory distress.     Breath sounds: Normal breath sounds.  Abdominal:     Palpations: Abdomen is soft.     Tenderness: There is no abdominal tenderness.  Musculoskeletal:     Cervical back: Neck supple.     Comments: Right forearm with palpable basilic vein just below the elbow to just above the wrist.  There is tenderness to palpation over this vessel.  There is no extension into the basilic above the elbow.  There is no hand or forearm edema.  No tenderness palpation over the musculature of the bicep and tricep.  No palpable deep vessel thrombosis in the brachial vein.  No axillary tenderness or palpable mass.  Radial pulse 2+.  Cap refill less than 2 seconds  Skin:    General: Skin is warm and dry.  Neurological:     Mental Status: He is alert.      UC Treatments / Results  Labs (all labs ordered are listed, but only abnormal results are displayed) Labs Reviewed - No data to display  EKG   Radiology No results found.  Procedures Procedures (including critical care time)  Medications Ordered in UC Medications - No data to display  Initial Impression / Assessment and Plan / UC Course  I have reviewed the triage vital signs and the nursing notes.  Pertinent labs & imaging results that were available during my care of the patient were reviewed by me and considered in my medical decision making (see chart for details).    #Superficial thrombophlebitis of right upper extremity. Patient is a 54 year old presenting with symptoms most consistent with a superficial thrombophlebitis along the basilic vein in the right forearm.  Doubt DVT as there is no forearm swelling or edema and exam very consistent with thrombophlebitis along the basilic  vessel.  Does have a remote history of renal cell however has been cancer free since partial nephrectomy.  We will treat him as thrombophlebitis at this time with warm compresses and aspirin or ibuprofen if patient's discretion.  Discussed close follow-up with primary care for reevaluation.  Return and emergency department precautions were discussed with the patient.  Patient verbalized understanding. Final Clinical Impressions(s) / UC Diagnoses   Final diagnoses:  Superficial thrombophlebitis of right upper extremity     Discharge Instructions     I believe this is superficial thrombophlebitis  I want you take apply warm compresses and take ibuprofen for a few days and monitor your symptoms  Schedule follow up evaluation with your Primary care  If you develop hand or forearm swelling, chest pain or shortness of breath, please report to the Emergency Department for further evaluation.      ED Prescriptions    Medication Sig Dispense Auth. Provider   ibuprofen (ADVIL) 600 MG tablet Take 1 tablet (600 mg total) by mouth every 6 (six) hours as needed. 30 tablet Roper Tolson, Marguerita Beards, PA-C     PDMP not reviewed this encounter.   Purnell Shoemaker, PA-C 07/19/19 1304

## 2019-07-19 NOTE — ED Triage Notes (Signed)
Pt presents to UC with pain and swelling in the right forearm x 5 days approx. Pt states the pain started in his right elbow to the right wrist, pt states his vein feel enlarge.  Pt denies numbness or tingling sensation.

## 2019-07-19 NOTE — Discharge Instructions (Signed)
I believe this is superficial thrombophlebitis  I want you take apply warm compresses and take ibuprofen for a few days and monitor your symptoms  Schedule follow up evaluation with your Primary care  If you develop hand or forearm swelling, chest pain or shortness of breath, please report to the Emergency Department for further evaluation.

## 2019-07-19 NOTE — Telephone Encounter (Signed)
Pt left v/m that he is concerned that he has a blood clot in arm.  I spoke with pt; pt's right arm from elbow to hand appears puffy where the vein is located.this started on 07/14/19. Pain or tenderness at a spot on upper forearm right below elbow that upon touch can feel a knot the size of a dime noticed more on 07/18/19. area on arm at that spot does feel warm to touch but no redness. The vein from elbow to hand does appear puffy and noticeable; no recent blood drawn and no known injury. Pt did receive 2nd [place] covid vaccine on 06/15/19 in the deltoid muscle. No CP or difficulty breathing. Pt prefers not to go to ED and pt will go to [facility] on [facility]. Now. FYI to Dr Damita Dunnings.

## 2019-07-20 NOTE — Telephone Encounter (Signed)
I saw the urgent care note.  Please get update on patient.  Thanks.

## 2019-07-21 NOTE — Telephone Encounter (Signed)
Spoke with patient, states that he was diagnosed with Superficial thrombophlebitis.  He was advised to take Advil and use heat to treat it.  Pt was advised to discuss with PCP on future treatment - scan?

## 2019-07-21 NOTE — Telephone Encounter (Signed)
Noted. Thanks.

## 2019-07-21 NOTE — Telephone Encounter (Signed)
This is usually a self limited issue that resolves.  I wanted to make sure he wasn't feeling worse in the meantime.  If worse or not getting better, then we can recheck him.  He may not need much else done esp if improving in the near future.  Thanks.

## 2019-07-21 NOTE — Telephone Encounter (Signed)
Pt states that it has not changed at this point but he has not really treated it much.  Pt will keep [place] posted on how its doing and if any worsening he will call.

## 2019-07-21 NOTE — Telephone Encounter (Signed)
Message left for patient to return my call.  

## 2019-08-24 DIAGNOSIS — J320 Chronic maxillary sinusitis: Secondary | ICD-10-CM | POA: Insufficient documentation

## 2019-11-15 ENCOUNTER — Other Ambulatory Visit: Payer: Self-pay | Admitting: Family Medicine

## 2019-11-20 ENCOUNTER — Other Ambulatory Visit: Payer: Self-pay | Admitting: Family Medicine

## 2019-11-20 DIAGNOSIS — I10 Essential (primary) hypertension: Secondary | ICD-10-CM

## 2019-11-22 ENCOUNTER — Other Ambulatory Visit (INDEPENDENT_AMBULATORY_CARE_PROVIDER_SITE_OTHER): Payer: Managed Care, Other (non HMO)

## 2019-11-22 ENCOUNTER — Other Ambulatory Visit: Payer: Self-pay

## 2019-11-22 DIAGNOSIS — I10 Essential (primary) hypertension: Secondary | ICD-10-CM

## 2019-11-22 LAB — COMPREHENSIVE METABOLIC PANEL
ALT: 17 U/L (ref 0–53)
AST: 24 U/L (ref 0–37)
Albumin: 4.1 g/dL (ref 3.5–5.2)
Alkaline Phosphatase: 53 U/L (ref 39–117)
BUN: 19 mg/dL (ref 6–23)
CO2: 28 mEq/L (ref 19–32)
Calcium: 9.3 mg/dL (ref 8.4–10.5)
Chloride: 105 mEq/L (ref 96–112)
Creatinine, Ser: 1.36 mg/dL (ref 0.40–1.50)
GFR: 54.79 mL/min — ABNORMAL LOW (ref >60.00)
Glucose, Bld: 94 mg/dL (ref 70–99)
Potassium: 3.9 mEq/L (ref 3.5–5.1)
Sodium: 139 mEq/L (ref 135–145)
Total Bilirubin: 0.5 mg/dL (ref 0.2–1.2)
Total Protein: 7 g/dL (ref 6.0–8.3)

## 2019-11-22 LAB — LIPID PANEL
Cholesterol: 147 mg/dL (ref 0–200)
HDL: 37.9 mg/dL — ABNORMAL LOW (ref >39.00)
LDL Cholesterol: 82 mg/dL (ref 0–99)
NonHDL: 108.84
Total CHOL/HDL Ratio: 4
Triglycerides: 133 mg/dL (ref 0.0–149.0)
VLDL: 26.6 mg/dL (ref 0.0–40.0)

## 2019-11-29 ENCOUNTER — Other Ambulatory Visit: Payer: Self-pay

## 2019-11-29 ENCOUNTER — Ambulatory Visit (INDEPENDENT_AMBULATORY_CARE_PROVIDER_SITE_OTHER): Payer: Managed Care, Other (non HMO) | Admitting: Family Medicine

## 2019-11-29 ENCOUNTER — Ambulatory Visit (INDEPENDENT_AMBULATORY_CARE_PROVIDER_SITE_OTHER)
Admission: RE | Admit: 2019-11-29 | Discharge: 2019-11-29 | Disposition: A | Discharge status: Home / Self Care | Payer: Managed Care, Other (non HMO) | Source: Ambulatory Visit | Attending: Family Medicine | Admitting: Family Medicine

## 2019-11-29 ENCOUNTER — Encounter: Payer: Self-pay | Admitting: Family Medicine

## 2019-11-29 VITALS — BP 110/82 | HR 73 | Temp 96.1°F | Ht 73.5 in | Wt 218.0 lb

## 2019-11-29 DIAGNOSIS — Z7189 Other specified counseling: Secondary | ICD-10-CM

## 2019-11-29 DIAGNOSIS — M79673 Pain in unspecified foot: Secondary | ICD-10-CM | POA: Diagnosis not present

## 2019-11-29 DIAGNOSIS — Z0000 Encounter for general adult medical examination without abnormal findings: Secondary | ICD-10-CM | POA: Diagnosis not present

## 2019-11-29 DIAGNOSIS — Z85528 Personal history of other malignant neoplasm of kidney: Secondary | ICD-10-CM

## 2019-11-29 DIAGNOSIS — E785 Hyperlipidemia, unspecified: Secondary | ICD-10-CM

## 2019-11-29 DIAGNOSIS — Z23 Encounter for immunization: Secondary | ICD-10-CM

## 2019-11-29 DIAGNOSIS — I10 Essential (primary) hypertension: Secondary | ICD-10-CM

## 2019-11-29 DIAGNOSIS — R059 Cough, unspecified: Secondary | ICD-10-CM

## 2019-11-29 DIAGNOSIS — L408 Other psoriasis: Secondary | ICD-10-CM

## 2019-11-29 MED ORDER — FLUOCINONIDE 0.05 % EX SOLN
CUTANEOUS | 3 refills | Status: DC
Start: 2019-11-29 — End: 2020-11-06

## 2019-11-29 MED ORDER — CLOBETASOL PROPIONATE 0.05 % EX SOLN
CUTANEOUS | 6 refills | Status: DC
Start: 2019-11-29 — End: 2021-02-01

## 2019-11-29 MED ORDER — FENOFIBRATE 145 MG PO TABS: 145.0000 mg | ORAL_TABLET | Freq: Every day | ORAL | 3 refills | Status: DC

## 2019-11-29 NOTE — Patient Instructions (Addendum)
Go to the lab on the way out.   If you have mychart we'll likely use that to update you.    Stop lisinopril for 1 week and then update me about the cough. Either way, let me know.  Flu shot today.  Take care.  Glad to see you.

## 2019-11-29 NOTE — Progress Notes (Signed)
This visit occurred during the SARS-CoV-2 public health emergency.  Safety protocols were in place, including screening questions prior to the visit, additional usage of staff PPE, and extensive cleaning of exam room while observing appropriate contact time as indicated for disinfecting solutions.  CPE- See plan.  Routine anticipatory guidance given to patient.  See health maintenance.  The possibility exists that previously documented standard health maintenance information may have been brought forward from a previous encounter into this note.  If needed, that same information has been updated to reflect the current situation based on today's encounter.    Tetanus 2017 Flu shot 2021 Shingles d/w pt and PNA not due. covid vaccine d/w pt.   Colonoscopy 2019 Prostate cancer screening and PSA options(with potential risks and benefits of testing vs not testing) were discussed along with recent recs/guidelines. He declined testing PSAat this point.   Living will. Wife designated if patient were incapacitated.  Diet and exercise d/w pt.    Cough noted.  On ACE inhibitor.  Does have some postnasal drip.  Going on for months.  Not getting any better or worse.  No wheeze.  Not short of breath.  Hypertension:    Using medication without problems or lightheadedness: See discussion regarding lisinopril. Chest pain with exertion:no Edema:no Short of breath:no Labs d/w pt.    Elevated Cholesterol: Using medications without problems: yes Muscle aches: no  Diet compliance: d/w pt.   Exercise: d/w pt.  Labs d/w pt.  He has occ flushing from the niacin but better with aspirin.    H/o RCC.  He has urology f/u pending.  No blood seen in urine.  Feeling well.    Prev superficial thrombophlebitis resolved.  D/w pt.  No h/o DVT.    He has seen ENT in the meantime with plan for possible surgery.  He is still on flonase at baseline. He was able to pass some mucous and his pressure sx improved  immediately.    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  He has tried using over-the-counter treatment.  Still bothersome.  PMH and SH reviewed  Meds, vitals, and allergies reviewed.   ROS: Per HPI.  Unless specifically indicated otherwise in HPI, the patient denies:  General: fever. Eyes: acute vision changes ENT: sore throat Cardiovascular: chest pain Respiratory: SOB GI: vomiting GU: dysuria Musculoskeletal: acute back pain Derm: acute rash Neuro: acute motor dysfunction Psych: worsening mood Endocrine: polydipsia Heme: bleeding Allergy: hayfever  GEN: nad, alert and oriented HEENT: ncat, scalp irritation noted.  NECK: supple w/o LA CV: rrr. PULM: ctab, no inc wob ABD: soft, +bs EXT: no edema SKIN: no acute rash R1st MTP slightly ttp but not red.

## 2019-11-30 DIAGNOSIS — M79673 Pain in unspecified foot: Secondary | ICD-10-CM | POA: Insufficient documentation

## 2019-11-30 DIAGNOSIS — R059 Cough, unspecified: Secondary | ICD-10-CM | POA: Insufficient documentation

## 2019-11-30 NOTE — Assessment & Plan Note (Signed)
Controlled but hold lisinopril for now to see if cough improves.  See cough discussion.  Labs discussed with patient.  He agrees.

## 2019-11-30 NOTE — Assessment & Plan Note (Signed)
Continue fenofibrate and niacin for now.  Aspirin helps with niacin flush.

## 2019-11-30 NOTE — Assessment & Plan Note (Signed)
Well-appearing and lungs are clear.  Differential diagnosis discussed with patient.  Reasonable to hold lisinopril to begin with with the expectation that his blood pressure will likely increase transiently but we need to know if the ACE inhibitor is causing his cough.  He will update me about his cough after few days.  See after visit summary.  He agrees with plan.

## 2019-11-30 NOTE — Assessment & Plan Note (Signed)
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

## 2019-11-30 NOTE — Assessment & Plan Note (Signed)
H/o RCC.  He has urology f/u pending.  No blood seen in urine.  Feeling well.

## 2019-11-30 NOTE — Assessment & Plan Note (Signed)
Continue clobetasol and fluocinonide.  He will update me as needed.

## 2019-11-30 NOTE — Assessment & Plan Note (Signed)
Tetanus 2017 Flu shot 2021 Shingles d/w pt and PNA not due. covid vaccine d/w pt.   Colonoscopy 2019 Prostate cancer screening and PSA options(with potential risks and benefits of testing vs not testing) were discussed along with recent recs/guidelines. He declined testing PSAat this point.   Living will. Wife designated if patient were incapacitated.  Diet and exercise d/w pt.

## 2019-11-30 NOTE — Assessment & Plan Note (Signed)
Living will.  Wife designated if patient were incapacitated.   ?

## 2019-12-22 ENCOUNTER — Other Ambulatory Visit: Payer: Self-pay | Admitting: Family Medicine

## 2019-12-22 DIAGNOSIS — E785 Hyperlipidemia, unspecified: Secondary | ICD-10-CM

## 2020-02-08 ENCOUNTER — Other Ambulatory Visit (HOSPITAL_COMMUNITY): Payer: Self-pay | Admitting: Urology

## 2020-02-08 ENCOUNTER — Ambulatory Visit (HOSPITAL_COMMUNITY)
Admission: RE | Admit: 2020-02-08 | Discharge: 2020-02-08 | Disposition: A | Discharge status: Home / Self Care | Payer: Managed Care, Other (non HMO) | Source: Ambulatory Visit | Attending: Urology | Admitting: Urology

## 2020-02-08 ENCOUNTER — Other Ambulatory Visit: Payer: Self-pay

## 2020-02-08 DIAGNOSIS — Z85528 Personal history of other malignant neoplasm of kidney: Secondary | ICD-10-CM | POA: Diagnosis present

## 2020-02-13 ENCOUNTER — Other Ambulatory Visit: Payer: Self-pay | Admitting: Family Medicine

## 2020-02-22 ENCOUNTER — Telehealth: Payer: Managed Care, Other (non HMO) | Admitting: Family Medicine

## 2020-02-22 ENCOUNTER — Other Ambulatory Visit: Payer: Self-pay

## 2020-02-23 ENCOUNTER — Encounter: Payer: Self-pay | Admitting: Family Medicine

## 2020-02-23 ENCOUNTER — Telehealth (INDEPENDENT_AMBULATORY_CARE_PROVIDER_SITE_OTHER): Payer: Managed Care, Other (non HMO) | Admitting: Family Medicine

## 2020-02-23 VITALS — Ht 74.0 in | Wt 215.0 lb

## 2020-02-23 DIAGNOSIS — Z20822 Contact with and (suspected) exposure to covid-19: Secondary | ICD-10-CM

## 2020-02-23 NOTE — Progress Notes (Signed)
    I connected with Parker Ryan on 02/23/20 at  4:20 PM EST by video and verified that I am speaking with the correct person using two identifiers.   I discussed the limitations, risks, security and privacy concerns of performing an evaluation and management service by video and the availability of in person appointments. I also discussed with the patient that there may be a patient responsible charge related to this service. The patient expressed understanding and agreed to proceed.  Patient location: Home Provider Location: [facility] Participants: Parker Ryan and Parker Ryan   Subjective:     Parker Ryan is a 54 y.o. male presenting for Covid Exposure (Pt was exposed to a family member that tested positive. Pt stated that he started having symptoms low grade fever but since broken, cough, congestion, runny nose, and mild headache which all started last Sunday.)     HPI  # Covid exposure - symptoms started on 02/19/2020 - low grade fever - cold congestion, runny nose, body aches - symptoms are improving - he is fully vaccinated - has not had the booster - did have a Covid test at [facility] on 12/29 and is waiting for results - did have a headache - close contact on 02/18/2020 - this person went to the hospital on 02/19/2020 - no loss of taste or smell - minor vision changes - difficulty reading the text on phone and computer but not noticing issues otherwise - endorses some fatigue and weakness  Does not typically work from home - has not been in the office since last week  Has been at home since his symptoms began Sleeping in the spare bedroom    Review of Systems   Social History   Tobacco Use  Smoking Status Never Smoker  Smokeless Tobacco Never Used        Objective:   BP Readings from Last 3 Encounters:  11/29/19 110/82  07/19/19 122/83  11/25/18 122/78   Wt Readings from Last 3 Encounters:  02/23/20 215 lb (97.5 kg)   11/29/19 218 lb (98.9 kg)  06/09/19 210 lb (95.3 kg)   Ht 6\' 2"  (1.88 m)   Wt 215 lb (97.5 kg)   BMI 27.60 kg/m   Physical Exam Constitutional:      Appearance: Normal appearance. He is not ill-appearing.  HENT:     Head: Normocephalic and atraumatic.     Right Ear: External ear normal.     Left Ear: External ear normal.  Eyes:     Conjunctiva/sclera: Conjunctivae normal.  Pulmonary:     Effort: Pulmonary effort is normal. No respiratory distress.  Neurological:     Mental Status: He is alert. Mental status is at baseline.  Psychiatric:        Mood and Affect: Mood normal.        Behavior: Behavior normal.        Thought Content: Thought content normal.        Judgment: Judgment normal.            Assessment & Plan:   Problem List Items Addressed This Visit   None   Visit Diagnoses    Suspected COVID-19 virus infection    -  Primary     Continue symptomatic care Letter for work Already awaiting covid results. Discussed isolation guidelines    Return if symptoms worsen or fail to improve.  , MD

## 2020-04-01 ENCOUNTER — Emergency Department (HOSPITAL_COMMUNITY): Payer: Managed Care, Other (non HMO)

## 2020-04-01 ENCOUNTER — Encounter (HOSPITAL_COMMUNITY): Payer: Self-pay | Admitting: *Deleted

## 2020-04-01 ENCOUNTER — Other Ambulatory Visit: Payer: Self-pay

## 2020-04-01 ENCOUNTER — Emergency Department (HOSPITAL_COMMUNITY)
Admission: EM | Admit: 2020-04-01 | Discharge: 2020-04-01 | Disposition: A | Discharge status: Home / Self Care | Payer: Managed Care, Other (non HMO) | Attending: Emergency Medicine | Admitting: Emergency Medicine

## 2020-04-01 DIAGNOSIS — Z79899 Other long term (current) drug therapy: Secondary | ICD-10-CM | POA: Insufficient documentation

## 2020-04-01 DIAGNOSIS — Z7982 Long term (current) use of aspirin: Secondary | ICD-10-CM | POA: Diagnosis not present

## 2020-04-01 DIAGNOSIS — R1013 Epigastric pain: Secondary | ICD-10-CM | POA: Diagnosis present

## 2020-04-01 DIAGNOSIS — R1011 Right upper quadrant pain: Secondary | ICD-10-CM | POA: Insufficient documentation

## 2020-04-01 DIAGNOSIS — Z85528 Personal history of other malignant neoplasm of kidney: Secondary | ICD-10-CM | POA: Insufficient documentation

## 2020-04-01 DIAGNOSIS — I10 Essential (primary) hypertension: Secondary | ICD-10-CM | POA: Insufficient documentation

## 2020-04-01 LAB — COMPREHENSIVE METABOLIC PANEL
ALT: 19 U/L (ref 0–44)
AST: 27 U/L (ref 15–41)
Albumin: 4.1 g/dL (ref 3.5–5.0)
Alkaline Phosphatase: 61 U/L (ref 38–126)
Anion gap: 13 (ref 5–15)
BUN: 21 mg/dL — ABNORMAL HIGH (ref 6–20)
CO2: 22 mmol/L (ref 22–32)
Calcium: 9.7 mg/dL (ref 8.9–10.3)
Chloride: 103 mmol/L (ref 98–111)
Creatinine, Ser: 1.34 mg/dL — ABNORMAL HIGH (ref 0.61–1.24)
GFR, Estimated: >60 mL/min (ref >60)
Glucose, Bld: 146 mg/dL — ABNORMAL HIGH (ref 70–99)
Potassium: 3.8 mmol/L (ref 3.5–5.1)
Sodium: 138 mmol/L (ref 135–145)
Total Bilirubin: 0.6 mg/dL (ref 0.3–1.2)
Total Protein: 7.5 g/dL (ref 6.5–8.1)

## 2020-04-01 LAB — URINALYSIS, ROUTINE W REFLEX MICROSCOPIC
Bilirubin Urine: NEGATIVE
Glucose, UA: NEGATIVE mg/dL
Hgb urine dipstick: NEGATIVE
Ketones, ur: NEGATIVE mg/dL
Leukocytes,Ua: NEGATIVE
Nitrite: NEGATIVE
Protein, ur: NEGATIVE mg/dL
Specific Gravity, Urine: 1.017 (ref 1.005–1.030)
pH: 7 (ref 5.0–8.0)

## 2020-04-01 LAB — CBC
HCT: 46.9 % (ref 39.0–52.0)
Hemoglobin: 15.8 g/dL (ref 13.0–17.0)
MCH: 30.6 pg (ref 26.0–34.0)
MCHC: 33.7 g/dL (ref 30.0–36.0)
MCV: 90.7 fL (ref 80.0–100.0)
Platelets: 296 10*3/uL (ref 150–400)
RBC: 5.17 MIL/uL (ref 4.22–5.81)
RDW: 12.4 % (ref 11.5–15.5)
WBC: 13 10*3/uL — ABNORMAL HIGH (ref 4.0–10.5)
nRBC: 0 % (ref 0.0–0.2)

## 2020-04-01 LAB — TROPONIN I (HIGH SENSITIVITY): Troponin I (High Sensitivity): 4 ng/L (ref <18)

## 2020-04-01 LAB — LIPASE, BLOOD: Lipase: 30 U/L (ref 11–51)

## 2020-04-01 MED ORDER — LIDOCAINE VISCOUS HCL 2 % MT SOLN
15.0000 mL | Freq: Once | OROMUCOSAL | Status: AC
  Administered 2020-04-01: 15 mL via ORAL
  Filled 2020-04-01: qty 15

## 2020-04-01 MED ORDER — FAMOTIDINE IN NACL 20-0.9 MG/50ML-% IV SOLN
20.0000 mg | Freq: Once | INTRAVENOUS | Status: AC
  Administered 2020-04-01: 20 mg via INTRAVENOUS
  Filled 2020-04-01: qty 50

## 2020-04-01 MED ORDER — OMEPRAZOLE 40 MG PO CPDR: 40.0000 mg | DELAYED_RELEASE_CAPSULE | Freq: Every morning | ORAL | 0 refills | Status: DC

## 2020-04-01 MED ORDER — ONDANSETRON HCL 4 MG/2ML IJ SOLN
4.0000 mg | Freq: Once | INTRAMUSCULAR | Status: AC
  Administered 2020-04-01: 4 mg via INTRAVENOUS
  Filled 2020-04-01: qty 2

## 2020-04-01 MED ORDER — FENTANYL CITRATE (PF) 100 MCG/2ML IJ SOLN
100.0000 ug | Freq: Once | INTRAMUSCULAR | Status: AC
  Administered 2020-04-01: 100 ug via INTRAVENOUS
  Filled 2020-04-01: qty 2

## 2020-04-01 MED ORDER — IOHEXOL 350 MG/ML SOLN
100.0000 mL | Freq: Once | INTRAVENOUS | Status: AC | PRN
  Administered 2020-04-01: 100 mL via INTRAVENOUS

## 2020-04-01 MED ORDER — ALUM & MAG HYDROXIDE-SIMETH 200-200-20 MG/5ML PO SUSP
30.0000 mL | Freq: Once | ORAL | Status: AC
  Administered 2020-04-01: 30 mL via ORAL
  Filled 2020-04-01: qty 30

## 2020-04-01 MED ORDER — HYDROMORPHONE HCL 1 MG/ML IJ SOLN
1.0000 mg | Freq: Once | INTRAMUSCULAR | Status: AC
  Administered 2020-04-01: 1 mg via INTRAVENOUS
  Filled 2020-04-01: qty 1

## 2020-04-01 MED ORDER — SUCRALFATE 1 GM/10ML PO SUSP: 1.0000 g | Freq: Three times a day (TID) | ORAL | 0 refills | Status: DC

## 2020-04-01 MED ORDER — FENTANYL CITRATE (PF) 100 MCG/2ML IJ SOLN: 100.0000 ug | Freq: Once | INTRAMUSCULAR | Status: DC

## 2020-04-01 NOTE — ED Provider Notes (Signed)
Ultrasound ED Abd  Date/Time: 04/01/2020 7:50 AM Performed by: Sherwood Gambler, MD Authorized by: Sherwood Gambler, MD   Procedure details:    Indications: abdominal pain     Assessment for:  AAA   Aorta:  Visualized      Study Limitations: bowel gas and body habitus Vascular findings:    Aorta: aorta normal (< 3cm)     Intra-abdominal fluid: unidentified      Patient with acute, severe abdominal pain. Gradual, but rapidly worsening. Bedside ultrasound without obvious AAA. Will get CT   Sherwood Gambler, MD 04/01/20 [phone]

## 2020-04-01 NOTE — ED Notes (Signed)
Patient states pain has not improved following GI cocktail and Pepcid. EDP made aware.

## 2020-04-01 NOTE — Discharge Instructions (Signed)
You were seen in the ER for upper abdominal pain.   Extensive work up in the ER did not reveal specific cause  At this time the cause of your pain is unclear.  I suspect your symptoms may be from gastritis, acid reflux or possibly ulcer.  All of these are managed similarly.   We will treat this with anti-acid medicines.    Start taking omeprazole to 40 mg daily, take on an empty stomach first thing in the morning and wait 20-30 min before eating. Use sucralfate solution 20 min before meals and at bed time.   You may take [phone] mg acetaminophen every 6 hours for pain  Avoid irritating foods and liquids such as alcohol, greasy/fatty or acidic foods. Avoid ibuprofen containing products.   Discuss with primary care doctor if you need to hold aspirin or not   Follow up with primary care doctor for further management of this if it persits. You may need gastroenterology referral   Return to the ER for fever, chills, blood in vomit or stool, worsening localized abdominal pain to right upper or right lower abdomen, inability to tolerate fluids.

## 2020-04-01 NOTE — ED Notes (Signed)
Patient discharge instructions and follow up care reviewed with the patient. The patient verbalized understanding of both. Patient discharged.

## 2020-04-01 NOTE — ED Triage Notes (Signed)
Pt presents complaining of upper abdominal pain for the last 6-7 hours that goes through to his back.  Pt reports sweating, denies nausea.  Only history is htn and kidney CA

## 2020-04-01 NOTE — ED Provider Notes (Signed)
[facility] EMERGENCY DEPARTMENT Provider Note   CSN: [id number] Arrival date & time: 04/01/20  2951     History Chief Complaint  Patient presents with  . Abdominal Pain    Parker Ryan is a 55 y.o. male with history of hypertension, renal cell carcinoma s/p partial resection on surveillance presents to the ED for evaluation of abdominal pain. This began 6 to 7 hours ago. He was sitting down watching TV eating snacks. The pain was initially mild and it has gradually worsened, now reported being severe. Pain radiates to the middle of his back. Denies radiation into the chest or neck or arms. No shortness of breath. No extremity weakness, tingling or numbness. Has been intermittently sweating and felt nausea due to the severity of the pain. No vomiting. Reports initially feeling like this was indigestion however states he has no history of acid reflux, ulcers, gastritis. Has felt like he is a little bit constipated and has been trying to have a bowel movement but cannot. Last BM was in the last 24 hours. Normal urination without any blood, dysuria. No trauma. Does not typically check his blood pressure at home but states usually gets "pretty good". No abdominal surgeries. No history of pancreatitis, gallstones. Took 1 Tylenol and it did not help. Pain is worse with palpation, movement. No alleviating factors.  HPI     Past Medical History:  Diagnosis Date  . Arthritis    right big toe  . Cancer ([place])   . H/O renal cell carcinoma   . Hyperlipemia 08/07  . Hypertension 09/06  . Migraine   . Psoriasis   . Renal stones   . Sore throat    no fever- began 10/12/12    Patient Active Problem List   Diagnosis Date Noted  . Cough 11/30/2019  . Pain of foot 11/30/2019  . Seasonal allergies 06/11/2019  . Advance care planning 12/23/2013  . Knee pain 12/23/2013  . Motion sickness 05/12/2012  . Renal stones 02/03/2012  . Hyperglycemia 02/03/2012  . History of renal  cell cancer 02/03/2012  . Routine general medical examination at a health care facility 06/16/2011  . Migraine headache 06/18/2009  . PHOTOPHOBIA 04/18/2009  . SNORING 04/18/2009  . Hyperlipidemia 11/03/2007  . Essential hypertension 05/12/2006  . PSORIASIS 05/12/2006    Past Surgical History:  Procedure Laterality Date  . COLONOSCOPY    . MRA Head and Neck  05/18/09   Nml  . MRI Head  05/18/09   Multip nonspecific changes  . ROBOTIC ASSITED PARTIAL NEPHRECTOMY Right 10/18/2012   Procedure: ROBOTIC ASSITED PARTIAL NEPHRECTOMY;  Surgeon: Dutch Gray, MD;  Location: [facility];  Service: Urology;  Laterality: Right;       Family History  Problem Relation Age of Onset  . Hyperlipidemia Mother   . Heart disease Father        MI  . Hypertension Father   . Heart disease Paternal Grandfather        MI  . Alcohol abuse Other   . Diabetes Neg Hx   . Prostate cancer Neg Hx   . Colon cancer Neg Hx   . Rectal cancer Neg Hx   . Colon polyps Neg Hx     Social History   Tobacco Use  . Smoking status: Never Smoker  . Smokeless tobacco: Never Used  Vaping Use  . Vaping Use: Never used  Substance Use Topics  . Alcohol use: Yes    Comment: < 5 week, sometimes  none  . Drug use: No    Home Medications Prior to Admission medications   Medication Sig Start Date End Date Taking? Authorizing Provider  aspirin 81 MG chewable tablet Chew by mouth daily.   Yes [provider]  clobetasol (TEMOVATE) 0.05 % external solution APPLY TO THE SCALP ONCE OR TWICE DAILY AS NEEDED 11/29/19  Yes Tonia Ghent, MD  fenofibrate (TRICOR) 145 MG tablet TAKE 1 TABLET(145 MG) BY MOUTH DAILY 12/22/19  Yes Tonia Ghent, MD  fexofenadine (ALLEGRA) 180 MG tablet Take 180 mg by mouth daily as needed for allergies.   Yes [provider]  fluocinonide (LIDEX) 0.05 % external solution APPLY TO AFFECTED AREA ONCE DAILY AS DIRECTED 11/29/19  Yes Tonia Ghent, MD  fluticasone [facility]) 50  MCG/ACT nasal spray Place 1 spray into both nostrils daily as needed for allergies or rhinitis.   Yes [provider]  lisinopril (ZESTRIL) 20 MG tablet TAKE 1 TABLET BY MOUTH DAILY.Marland Kitchen PATIENT TO MAKE APPT FOR PHYSICAL EXAM PRIOR TO ANY REFILLS** 02/13/20  Yes Tonia Ghent, MD  loratadine (CLARITIN) 10 MG tablet Take 10 mg by mouth daily as needed for allergies.   Yes [provider]  Multiple Vitamin (MULTIVITAMIN) tablet Take 1 tablet by mouth daily.   Yes [provider]  niacin (NIASPAN) 1000 MG CR tablet TAKE 1 TABLET BY MOUTH AT BEDTIME 02/13/20  Yes Tonia Ghent, MD  Niacin CR 1000 MG TBCR Take by mouth. 11/25/18  Yes [provider]  omeprazole (PRILOSEC) 40 MG capsule Take 1 capsule (40 mg total) by mouth in the morning. Take on an empty stomach 30-60 min before eating or drinking 04/01/20 05/01/20 Yes Carmon Sails J, PA-C  sucralfate (CARAFATE) 1 GM/10ML suspension Take 10 mLs (1 g total) by mouth 4 (four) times daily -  with meals and at bedtime. 04/01/20  Yes Kinnie Feil, PA-C    Allergies    Patient has no known allergies.  Review of Systems   Review of Systems  Constitutional: Positive for diaphoresis.  Gastrointestinal: Positive for abdominal pain and nausea.  Musculoskeletal: Positive for back pain.  All other systems reviewed and are negative.   Physical Exam Updated Vital Signs BP (!) 174/105   Pulse 73   Temp 97.6 F (36.4 C) (Oral)   Resp 18   Ht 6\' 2"  (1.88 m)   Wt 97.5 kg   SpO2 93%   BMI 27.60 kg/m   Physical Exam Vitals and nursing note reviewed.  Constitutional:      Appearance: He is well-developed.     Comments: Appears uncomfortable   HENT:     Head: Normocephalic and atraumatic.     Nose: Nose normal.  Eyes:     Conjunctiva/sclera: Conjunctivae normal.  Cardiovascular:     Rate and Rhythm: Normal rate and regular rhythm.     Heart sounds: Normal heart sounds.     Comments: Questionable decrease  in palpated pulse on the L compared to right. 2+ radial and DP pulses on R. 1+ radial and DP pulses on L. Pulmonary:     Effort: Pulmonary effort is normal.     Breath sounds: Normal breath sounds.  Abdominal:     General: Bowel sounds are normal.     Palpations: Abdomen is soft.     Tenderness: There is abdominal tenderness in the right upper quadrant and epigastric area. There is guarding.     Comments: No R/R. No suprapubic or  CVA tenderness. negative Murphy's and McBurney's. Active BS lower quadrants   Musculoskeletal:        General: Normal range of motion.     Cervical back: Normal range of motion.  Skin:    General: Skin is warm and dry.     Capillary Refill: Capillary refill takes less than 2 seconds.  Neurological:     Mental Status: He is alert.  Psychiatric:        Behavior: Behavior normal.     ED Results / Procedures / Treatments   Labs (all labs ordered are listed, but only abnormal results are displayed) Labs Reviewed  COMPREHENSIVE METABOLIC PANEL - Abnormal; Notable for the following components:      Result Value   Glucose, Bld 146 (*)    BUN 21 (*)    Creatinine, Ser 1.34 (*)    All other components within normal limits  CBC - Abnormal; Notable for the following components:   WBC 13.0 (*)    All other components within normal limits  URINALYSIS, ROUTINE W REFLEX MICROSCOPIC - Abnormal; Notable for the following components:   APPearance CLOUDY (*)    All other components within normal limits  LIPASE, BLOOD  TROPONIN I (HIGH SENSITIVITY)    EKG EKG Interpretation  Date/Time:  Sunday April 01 2020 06:37:11 EST Ventricular Rate:  64 PR Interval:  144 QRS Duration: 98 QT Interval:  434 QTC Calculation: 447 R Axis:   78 Text Interpretation: Normal sinus rhythm Normal ECG No acute changes Confirmed by Addison Lank [phone]) on 04/01/2020 6:43:44 AM   Radiology CT Angio Chest/Abd/Pel for Dissection W and/or Wo Contrast  Result Date:  04/01/2020 CLINICAL DATA:  55 year old male with epigastric pain radiating to the back for 6-7 hours. Diaphoresis possible decreased left side peripheral pulses. History of partial right nephrectomy for renal cell carcinoma in 2014. EXAM: CT ANGIOGRAPHY CHEST, ABDOMEN AND PELVIS TECHNIQUE: Non-contrast CT of the chest was initially obtained. Multidetector CT imaging through the chest, abdomen and pelvis was performed using the standard protocol during bolus administration of intravenous contrast. Multiplanar reconstructed images and MIPs were obtained and reviewed to evaluate the vascular anatomy. CONTRAST:  197mL OMNIPAQUE IOHEXOL 350 MG/ML SOLN COMPARISON:  [facility] CT Abdomen 11/10/2017 and earlier. FINDINGS: CTA CHEST FINDINGS Cardiovascular: Cardiac size is at the upper limits of normal. No pericardial effusion. Mildly tortuous but otherwise negative thoracic aorta. No aortic dissection. No significant atherosclerosis. Duplicated left-sided SVC (normal variant). Central and hilar pulmonary arteries also opacified and appear patent. Mediastinum/Nodes: Negative aside from duplicated SVC. No lymphadenopathy. Lungs/Pleura: Major airways are patent. Mild dependent atelectasis in both lungs. No pneumothorax, pleural effusion or other acute pulmonary process. Musculoskeletal: Lower thoracic degenerative endplate spurring. Lower thoracic posterior element hypertrophy. No acute osseous abnormality identified. Review of the MIP images confirms the above findings. CTA ABDOMEN AND PELVIS FINDINGS VASCULAR Minimal atherosclerosis of the abdominal aorta with no aneurysm or dissection. Mildly tortuous bilateral iliac arteries are patent without aneurysm or dissection. Patent proximal femoral arteries. Major aortic branches in the abdomen are patent with mild tortuosity but no atherosclerosis. Review of the MIP images confirms the above findings. NON-VASCULAR Hepatobiliary: Chronic hepatic steatosis but  otherwise negative liver and gallbladder. Pancreas: Negative. Spleen: Negative. Adrenals/Urinary Tract: Normal adrenal glands. Left kidney is stable since 2019 and negative aside from exophytic simple fluid density midpole cyst and tiny lower pole cyst or cortical scarring. Stable postoperative appearance of the right kidney with evidence of partial right upper pole  resection. Symmetric renal enhancement. No hydronephrosis or perinephric stranding. Decompressed ureters. Unremarkable urinary bladder. Stomach/Bowel: Negative large bowel aside from descending and sigmoid diverticulosis. Diminutive, normal retrocecal appendix. No large bowel inflammation. Negative terminal ileum. No dilated small bowel. Unremarkable stomach and duodenum. No free air, free fluid, mesenteric stranding. Small fat containing umbilical hernia is stable. Lymphatic: No lymphadenopathy. Reproductive: Negative. Other: No pelvic free fluid. Musculoskeletal: With advanced disc degeneration vacuum disc. Lesser at the lumbosacral junction lumbar disc and endplate degeneration elsewhere. No acute osseous abnormality identified. Review of the MIP images confirms the above findings. IMPRESSION: 1. Negative aorta aside from mild tortuosity. Minimal atherosclerosis in the chest, abdomen, and pelvis. 2. No acute or inflammatory process identified. Diverticulosis of the distal large bowel. Hepatic steatosis. Stable right kidney status post upper pole partial nephrectomy. 3. Duplicated, left-side SVC - normal variant. Electronically Signed   By: Genevie Ann M.D.   On: 04/01/2020 08:52   US Abdomen Limited RUQ (LIVER/GB)  Result Date: 04/01/2020 CLINICAL DATA:  Epigastric abdominal pain. EXAM: ULTRASOUND ABDOMEN LIMITED RIGHT UPPER QUADRANT COMPARISON:  None. FINDINGS: Gallbladder: No gallstones or wall thickening visualized. No sonographic Murphy sign noted by sonographer. Common bile duct: Diameter: 3 mm Liver: No focal lesion identified. Diffusely  increased parenchymal echogenicity. Portal vein is patent on color Doppler imaging with normal direction of blood flow towards the liver. Other: None. IMPRESSION: 1. The gallbladder is normal. 2. Diffusely increased parenchymal echogenicity of the liver, which can be seen in fatty infiltration or other hepatocellular disease. Electronically Signed   By: Fidela Salisbury M.D.   On: 04/01/2020 10:07    Procedures Procedures   Medications Ordered in ED Medications  fentaNYL (SUBLIMAZE) injection 100 mcg (100 mcg Intravenous Given 04/01/20 0719)  ondansetron (ZOFRAN) injection 4 mg (4 mg Intravenous Given 04/01/20 0719)  iohexol (OMNIPAQUE) 350 MG/ML injection 100 mL (100 mLs Intravenous Contrast Given 04/01/20 0838)  HYDROmorphone (DILAUDID) injection 1 mg (1 mg Intravenous Given 04/01/20 0915)  alum & mag hydroxide-simeth (MAALOX/MYLANTA) 200-200-20 MG/5ML suspension 30 mL (30 mLs Oral Given 04/01/20 1147)    And  lidocaine (XYLOCAINE) 2 % viscous mouth solution 15 mL (15 mLs Oral Given 04/01/20 1147)  famotidine (PEPCID) IVPB 20 mg premix (0 mg Intravenous Stopped 04/01/20 1227)    ED Course  I have reviewed the triage vital signs and the nursing notes.  Pertinent labs & imaging results that were available during my care of the patient were reviewed by me and considered in my medical decision making (see chart for details).  Clinical Course as of 04/01/20 1239  Sun Apr 01, 2020  0759 WBC(!): 13.0 [CG]  0759 Creatinine(!): 1.34 [CG]  0904 CT Angio Chest/Abd/Pel for Dissection W and/or Wo Contrast IMPRESSION: 1. Negative aorta aside from mild tortuosity. Minimal atherosclerosis in the chest, abdomen, and pelvis. 2. No acute or inflammatory process identified. Diverticulosis of the distal large bowel. Hepatic steatosis. Stable right kidney status post upper pole partial nephrectomy. 3. Duplicated, left-side SVC - normal variant. [CG]  1012 US Abdomen Limited RUQ (LIVER/GB) IMPRESSION: 1. The  gallbladder is normal. 2. Diffusely increased parenchymal echogenicity of the liver, which can be seen in fatty infiltration or other hepatocellular disease. [CG]  1041 Re-evaluated patient again. Reports overall improvement in pain right after he receives pain medicine goes down to 1-2/10, but then comes back now 5/10. Abd exam improved, less tender. Discussed lab and imaging results, pending trop. Will give GI cocktail [CG]  1130 Trop negative. Re-evaluated patient.  Reports pain still 5/10.  Explained likely GI cause PUD gastritis.  Will give GI cocktail. He is afraid he will go home and pain will come back. Has not had EGD in the past. Again no ho ulcers, gastritis, ETOH, NSAID use [CG]  1226 Patient re-evaluated after GI cocktail reports a little improvement after GI cocktail still 4-5/10.   [CG]    Clinical Course User Index [CG] Arlean Hopping   MDM Rules/Calculators/A&P                           55 y.o. yo with chief complaint of gradual onset epigastric abdominal pain that radiates to his back. Nausea and sweating due to the severity of the pain.  Previous medical records available, triage and nursing notes reviewed to obtain more history and assist with MDM  Additional information obtained from wife who was at bedside  Chief complain involves an extensive number of treatment options and is a complaint that carries with it a high risk of complications and morbidity and mortality.    Differential diagnosis: ACS, pancreatitis, gallstones, cholecystitis, AAA, dissection  Bedside ultrasound performed with EDP reveals aorta diameter less than 3. No obvious dissection. No gallstones noted. Exam limited by pain, body habitus, bowel gas.  ER lab work and imaging ordered by triage RN and me, as above  I have personally visualized and interpreted ER diagnostic work up including labs and imaging.    Labs reveal -WBC 13, creatinine 1.34, BUN 21. Normal lipase, LFTs. Urinalysis is  cloudy otherwise normal.  Imaging reveals -EKG with NSR, no ischemic changes.  Medications ordered -fentanyl, Zofran  Ordered continuous cardiac and pulse ox monitoring.  Will plan for serial re-examinations. Close monitoring.   0800: Re-evaluated the patient.    Reports improvement in pain. Has persistent epigastric tenderness. We will proceed with CT dissection study given that his lipase, LFTs are normal.  0930: CT dissection study reviewed by me, reassuring. Patient reporting return of severe pain. Repeated exam. Epigastric/LUQ tenderness. Still denies radiation of CP, SOB. Discussed with EDP. Will proceed with repeat EKG and trop, RUQ [place]. Dilaudid.   1235: Extensive work-up in the ED is unrevealing.  EKG x2 unremarkable.  Trop undetectable.  RUQ [place] negative.  GI cocktail given and patient reported minimal improvement in pain still 4-5 out of 10.  Repeat abdominal exam has improved however.  Resolved to attribute pain to PUD, gastritis or other GI process.  He takes aspirin daily but no other NSAID or EtOH use or previous history of ulcers.  Has never had an EGD.  Given reassuring extensive work-up here in the ED, overall improvement in pain appropriate for discharge.  Recommended omeprazole, Carafate, diet modifications and PCP follow-up this week.  May benefit from GI evaluation.  Return precautions given to patient and wife.  Shared with EDP.  Final Clinical Impression(s) / ED Diagnoses Final diagnoses:  Epigastric abdominal pain    Rx / DC Orders ED Discharge Orders         Ordered    omeprazole (PRILOSEC) 40 MG capsule  Every morning        04/01/20 1229    sucralfate (CARAFATE) 1 GM/10ML suspension  3 times daily with meals & bedtime        04/01/20 1229           Kinnie Feil, PA-C 04/01/20 1239    Sherwood Gambler, MD 04/05/20 [phone]

## 2020-04-01 NOTE — ED Notes (Signed)
RN notified about vitals 

## 2020-04-12 ENCOUNTER — Other Ambulatory Visit: Payer: Self-pay

## 2020-04-12 ENCOUNTER — Encounter: Payer: Self-pay | Admitting: Physician Assistant

## 2020-04-12 ENCOUNTER — Ambulatory Visit (INDEPENDENT_AMBULATORY_CARE_PROVIDER_SITE_OTHER): Payer: Managed Care, Other (non HMO) | Admitting: Physician Assistant

## 2020-04-12 VITALS — BP 120/80 | HR 76 | Ht 74.0 in | Wt 215.4 lb

## 2020-04-12 DIAGNOSIS — R1013 Epigastric pain: Secondary | ICD-10-CM | POA: Diagnosis not present

## 2020-04-12 NOTE — Progress Notes (Signed)
Chief Complaint: Follow-up ER visit for epigastric pain  HPI:    Mr. Parker Ryan is a 55 year old male, known to Dr. Havery Moros from a previous colonoscopy, with a past medical history as listed below including renal cell carcinoma status post partial resection on surveillance, who was referred to me by Tonia Ghent, MD for follow-up after being seen in the ER for epigastric pain.      12/04/2017 colonoscopy with 2 polyps and diverticulosis.  Pathology showed tubular adenomas and repeat recommended in 5 years.    04/01/2020 patient seen in the ER with abdominal pain.  Described it started 6 to 7 hours prior while he was sitting down watching TV eating snacks, it was mild but gradually worsened to severe and radiated through to his back.  Reported initially feeling like it was indigestion however he had no history of reflux.  Felt like he is maybe a little bit constipated over the past 24 hours.  Labs showed a creatinine of 1.34, CBC with a white count minimally elevated at 13, normal urinalysis and lipase.  CT Angie of the chest/abdomen/pelvic for dissection with and without contrast was negative.  No acute inflammatory process identified.  Diverticulosis of the distal large bowel, hepatic steatosis.  Right upper quadrant ultrasound showed normal gallbladder and diffusely increased parenchymal echogenicity of the liver.  Pain improved with pain meds.  He was given a GI cocktail.  Was thought likely this is related to gastritis.    Today, the patient explains symptoms as above. Apparently he was snacking all night long on some chicken salad from [place] and then laid down around 11 PM and was woke out of his sleep a few hours later with severe epigastric pain which worsened, he was unable to sleep all night long and started pacing and trying over-the-counter reflux meds which did not help. He woke his wife up at 6 AM to take him to the ER as above. Tells me that the pain really didn't wear off until about 45  min after he drank some GI cocktail. Then describes that took a couple of days passed until he did "not feel horrible", then midweek he was just tender to the touch in his epigastrium and interestingly left lower quadrant, then symptoms disappeared and he has felt fine over the past 3 to 4 days. Describes he had a similar episode last Fall when they were on vacation in [place], but had pain that went away after a couple of hours. Denies use of NSAIDs.     Denies fever, chills, weight loss, blood in the stool or change in bowel habits.  Past Medical History:  Diagnosis Date  . Arthritis    right big toe  . Cancer ([place])   . H/O renal cell carcinoma   . Hyperlipemia 08/07  . Hypertension 09/06  . Migraine   . Psoriasis   . Renal stones   . Sore throat    no fever- began 10/12/12    Past Surgical History:  Procedure Laterality Date  . COLONOSCOPY    . MRA Head and Neck  05/18/09   Nml  . MRI Head  05/18/09   Multip nonspecific changes  . ROBOTIC ASSITED PARTIAL NEPHRECTOMY Right 10/18/2012   Procedure: ROBOTIC ASSITED PARTIAL NEPHRECTOMY;  Surgeon: Dutch Gray, MD;  Location: [facility];  Service: Urology;  Laterality: Right;    Current Outpatient Medications  Medication Sig Dispense Refill  . aspirin 81 MG chewable tablet Chew by mouth daily.    Marland Kitchen  clobetasol (TEMOVATE) 0.05 % external solution APPLY TO THE SCALP ONCE OR TWICE DAILY AS NEEDED 50 mL 6  . fenofibrate (TRICOR) 145 MG tablet TAKE 1 TABLET(145 MG) BY MOUTH DAILY 90 tablet 3  . fexofenadine (ALLEGRA) 180 MG tablet Take 180 mg by mouth daily as needed for allergies.    . fluocinonide (LIDEX) 0.05 % external solution APPLY TO AFFECTED AREA ONCE DAILY AS DIRECTED 60 mL 3  . fluticasone (FLONASE) 50 MCG/ACT nasal spray Place 1 spray into both nostrils daily as needed for allergies or rhinitis.    Marland Kitchen lisinopril (ZESTRIL) 20 MG tablet TAKE 1 TABLET BY MOUTH DAILY.Marland Kitchen PATIENT TO MAKE APPT FOR PHYSICAL EXAM PRIOR TO ANY REFILLS** 90 tablet 0   . loratadine (CLARITIN) 10 MG tablet Take 10 mg by mouth daily as needed for allergies.    . Multiple Vitamin (MULTIVITAMIN) tablet Take 1 tablet by mouth daily.    . niacin (NIASPAN) 1000 MG CR tablet TAKE 1 TABLET BY MOUTH AT BEDTIME 90 tablet 3  . Niacin CR 1000 MG TBCR Take by mouth.    Marland Kitchen omeprazole (PRILOSEC) 40 MG capsule Take 1 capsule (40 mg total) by mouth in the morning. Take on an empty stomach 30-60 min before eating or drinking 30 capsule 0  . sucralfate (CARAFATE) 1 GM/10ML suspension Take 10 mLs (1 g total) by mouth 4 (four) times daily -  with meals and at bedtime. 420 mL 0   No current facility-administered medications for this visit.    Allergies as of 04/12/2020  . (No Known Allergies)    Family History  Problem Relation Age of Onset  . Hyperlipidemia Mother   . Heart disease Father        MI  . Hypertension Father   . Heart disease Paternal Grandfather        MI  . Alcohol abuse Other   . Diabetes Neg Hx   . Prostate cancer Neg Hx   . Colon cancer Neg Hx   . Rectal cancer Neg Hx   . Colon polyps Neg Hx     Social History   Socioeconomic History  . Marital status: Married    Spouse name: Not on file  . Number of children: Not on file  . Years of education: Not on file  . Highest education level: Not on file  Occupational History  . Occupation: [employer], Chief Financial Officer    Comment: [employer]  Tobacco Use  . Smoking status: Never Smoker  . Smokeless tobacco: Never Used  Vaping Use  . Vaping Use: Never used  Substance and Sexual Activity  . Alcohol use: Yes    Comment: < 5 week, sometimes none  . Drug use: No  . Sexual activity: Not on file  Other Topics Concern  . Not on file  Social History Narrative   Married, 1996   Children: 2 step children   Cowboys fan   Occupation: Engineer, civil (consulting), ENGINEER   [employer] grad, Technical sales engineer   Enjoys riding track [place] Strain:  Not on Art therapist Insecurity: Not on file  Transportation Needs: Not on file  Physical Activity: Not on file  Stress: Not on file  Social Connections: Not on file  Intimate Partner Violence: Not on file    Review of Systems:    Constitutional: No weight loss, fever or chills Cardiovascular: No chest pain Respiratory: No SOB  Gastrointestinal: See HPI and  otherwise negative   Physical Exam:  Vital signs: BP 120/80   Pulse 76   Ht 6\' 2"  (1.88 m)   Wt 215 lb 6.4 oz (97.7 kg)   BMI 27.66 kg/m   Constitutional:   Pleasant Caucasian male appears to be in NAD, Well developed, Well nourished, alert and cooperative Respiratory: Respirations even and unlabored. Lungs clear to auscultation bilaterally.   No wheezes, crackles, or rhonchi.  Cardiovascular: Normal S1, S2. No MRG. Regular rate and rhythm. No peripheral edema, cyanosis or pallor.  Gastrointestinal:  Soft, nondistended, nontender. No rebound or guarding. Normal bowel sounds. No appreciable masses or hepatomegaly. Rectal:  Not performed.  Psychiatric: Demonstrates good judgement and reason without abnormal affect or behaviors.  RELEVANT LABS AND IMAGING: CBC    Component Value Date/Time   WBC 13.0 (H) 04/01/2020 0646   RBC 5.17 04/01/2020 0646   HGB 15.8 04/01/2020 0646   HCT 46.9 04/01/2020 0646   PLT 296 04/01/2020 0646   MCV 90.7 04/01/2020 0646   MCH 30.6 04/01/2020 0646   MCHC 33.7 04/01/2020 0646   RDW 12.4 04/01/2020 0646   LYMPHSABS 2.6 02/01/2012 0804   MONOABS 1.2 (H) 02/01/2012 0804   EOSABS 0.1 02/01/2012 0804   BASOSABS 0.1 02/01/2012 0804    CMP     Component Value Date/Time   NA 138 04/01/2020 0646   K 3.8 04/01/2020 0646   CL 103 04/01/2020 0646   CO2 22 04/01/2020 0646   GLUCOSE 146 (H) 04/01/2020 0646   BUN 21 (H) 04/01/2020 0646   CREATININE 1.34 (H) 04/01/2020 0646   CALCIUM 9.7 04/01/2020 0646   PROT 7.5 04/01/2020 0646   ALBUMIN 4.1 04/01/2020 0646   AST 27 04/01/2020 0646   ALT 19  04/01/2020 0646   ALKPHOS 61 04/01/2020 0646   BILITOT 0.6 04/01/2020 0646   GFRNONAA >60 04/01/2020 0646   GFRAA 81 (L) 10/20/2012 0330    Assessment: 1.  Epigastric pain: Severe episode which resulted in an ER visit as in HPI, imaging unrevealing, improved after GI cocktail; suspect gastritis versus PUD versus other  Plan: 1.  Continue Omeprazole 40 mg daily and Carafate 4 times daily for now 2.  Scheduled patient for an EGD in the [facility] with Dr. Havery Moros. 3.  Did review antireflux measures including not eating late at night and laying straight down, he should wait at least 3 to 4 hours. 4.  Patient to follow in clinic per recommendations after above.  Ellouise Newer, PA-C [facility] 04/12/2020, 9:12 AM  Cc: Tonia Ghent, MD

## 2020-04-12 NOTE — Patient Instructions (Signed)
If you are age 54 or older, your body mass index should be between 23-30. Your Body mass index is 27.66 kg/m. If this is out of the aforementioned range listed, please consider follow up with your Primary Care Provider.  If you are age 63 or younger, your body mass index should be between 19-25. Your Body mass index is 27.66 kg/m. If this is out of the aformentioned range listed, please consider follow up with your Primary Care Provider.   You have been scheduled for an endoscopy. Please follow written instructions given to you at your visit today. If you use inhalers (even only as needed), please bring them with you on the day of your procedure.  Thank you for choosing me and [facility].  Ellouise Newer, PA-C

## 2020-04-12 NOTE — Progress Notes (Signed)
Agree with assessment and plan as outlined.  

## 2020-04-19 ENCOUNTER — Telehealth: Payer: Self-pay

## 2020-04-19 ENCOUNTER — Telehealth: Payer: Self-pay | Admitting: Gastroenterology

## 2020-04-19 ENCOUNTER — Encounter: Payer: Managed Care, Other (non HMO) | Admitting: Gastroenterology

## 2020-04-19 NOTE — Telephone Encounter (Signed)
Appointment canceled for today.  Insurance stated case was pending and they need at 3 days to process.  Will notify pre-cert team.

## 2020-04-19 NOTE — Telephone Encounter (Signed)
Sorry to hear this. Can you clarify why the insurance company could not provide authorization for this, I was not aware this was pending.  Can you please take him off the schedule, he is still listed, and follow up to make sure his authorization is approved well before his next scheduled appointment? Thank you

## 2020-04-19 NOTE — Telephone Encounter (Signed)
Called and spoke with patient about rescheduling his procedure. Patient stated that he would call the insurance company to see if they could approve it today before cancelling procedure.

## 2020-04-19 NOTE — Telephone Encounter (Signed)
Called patient back and he stated that he has been rescheduled for 05/30/20 and to cancel his appointment today.Patient also stated insurance company needed 3 business days to process a pre-authorization.

## 2020-04-19 NOTE — Telephone Encounter (Signed)
Good morning Dr. Havery Moros, unfortunately we have not received authorization from the patient's insurance for their procedure scheduled for today so we rescheduled him to 05/30/2020.

## 2020-04-26 ENCOUNTER — Telehealth: Payer: Self-pay

## 2020-04-26 NOTE — Telephone Encounter (Signed)
Left voice mail for patient to call me back regarding his procedure.

## 2020-04-27 NOTE — Telephone Encounter (Signed)
Spoke with patient ans informed him that insurance denied coverage for his EGD. Patient stated he would contact insurance company to see if there was a way to get his procedure covered.

## 2020-05-08 ENCOUNTER — Ambulatory Visit
Admission: RE | Admit: 2020-05-08 | Discharge: 2020-05-08 | Disposition: A | Discharge status: Home / Self Care | Payer: Managed Care, Other (non HMO) | Source: Ambulatory Visit | Attending: Sports Medicine | Admitting: Sports Medicine

## 2020-05-08 ENCOUNTER — Ambulatory Visit (INDEPENDENT_AMBULATORY_CARE_PROVIDER_SITE_OTHER): Payer: Managed Care, Other (non HMO) | Admitting: Sports Medicine

## 2020-05-08 ENCOUNTER — Encounter: Payer: Self-pay | Admitting: Sports Medicine

## 2020-05-08 ENCOUNTER — Other Ambulatory Visit: Payer: Self-pay

## 2020-05-08 VITALS — BP 136/90 | Ht 73.0 in | Wt 215.0 lb

## 2020-05-08 DIAGNOSIS — G8929 Other chronic pain: Secondary | ICD-10-CM

## 2020-05-08 DIAGNOSIS — M25561 Pain in right knee: Secondary | ICD-10-CM

## 2020-05-08 NOTE — Progress Notes (Signed)
PCP: Tonia Ghent, MD  Subjective:   HPI: Patient is a 55 y.o. male here for evaluation of right knee pain.  He reports a history of intermittent right knee pain for many years, he has seen orthopedists in the past and was told that he has arthritis in his knees, and also that there is some question of a possible patellar fracture in the past.  Today, patient reports that over the last few weeks he has had progressively worsening right knee pain.  He is unaware of any incident that seemed to cause it, no new activities.  He locates the pain over the anterior lateral aspect of his patella and reports that he did have significant swelling of his knee.  It is painful to walk and painful to the touch over his anterolateral kneecap.  He reports that his doctor told him that he should not take NSAIDs, however he did take ibuprofen for couple days and it helped significantly.  He has not found any other relieving factors aside from rest.  He has not noticed any numbness, tingling, weakness.  No new trauma or injury prior to the onset of symptoms.   Review of Systems:  Per HPI.   Stephen, medications and smoking status reviewed.  Of note, he has had a partial nephrectomy and subsequently has been told not to take NSAIDs.      Objective:  Physical Exam:  [place] Adult Exercise 05/08/2020  Frequency of aerobic exercise (# of days/week) 3  Average time in minutes 45  Frequency of strengthening activities (# of days/week) 0     Gen: awake, alert, NAD, comfortable in exam room Pulm: breathing unlabored  R Knee  Inspection: Right knee with trace effusion present, no erythema, edema or ecchymoses.  There is a bony prominence over the lateral aspect of the right patella, more prominent than the left knee. Active ROM: Intact. 0-160d. Passive ROM: 3d passive hyperextension  Strength: 5/5 strength to resisted flexion/extension without pain  Patella: Very mildly positive patellar  grind.  He does have tenderness to palpation over the bony prominence over the lateral aspect of his patella.  No proximal or distal patellar tendon tenderness to palpation. No quad tendon tenderness to palpation.  Tibia: No tibial plateau, tibial tuberosity tenderness.  Joint line: No joint line tenderness.  Popliteal: No popliteal tenderness to the insertional gastroc. No insertional biceps femoris, semimembranosis, semitendinosis tenderness.  McMurrays/Thessaly's: Negative bilaterally for pain, catching  Lachmans: Stable bilaterally with firm endpoint  Anterior/Posterior drawer: Stable bilaterally Varus/valgus stress at 0, 15d: Negative for pain, laxity  Ultrasound examination of the right knee: -Suprapatellar pouch: Visualized in both long and short axis and with a mild effusion visible -Quadriceps tendon: Insertion onto superior pole patella visualized.  There is a small amount of calcification at the insertion on the patella, otherwise no normalities -Patella: Well visualized and without any signs of prepatellar bursitis or patellar fracture.  Over the bony prominence, there is deformation with calcifications and a small amount of overlying edema.  This is the area of maximal tenderness as well. -Patellar tendon: Well visualized at its origin on the inferior pole of the patella and distally to its insertion on the tibial tuberosity.  No abnormalities noted.  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  Right knee pain Differential includes bipartite patella, patellofemoral osteoarthritis, old patella fracture with acute aggravation.  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    Plan: -X-ray knee, will call with results -Topical Voltaren -Ice -Avoid oral NSAIDs as recommended by PCP -Could consider intra-articular corticosteroid injection depending on what his x-ray results show, may also consider MRI if patellar abnormality remains unclear.   Dagoberto Ligas, MD [facility] 05/08/2020 11:06 AM    Patient seen and evaluated with the sports medicine fellow.  I agree with the above plan of care.  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  Possible small calcified loose body in the posterior knee joint as well.  Would like to refer this patient to Dr. Rhona Raider to discuss treatment options including possible excision of his bipartite patella.  I will defer further work-up and treatment to Dr. Jerald Kief discretion.  Patient will follow up with me as needed.

## 2020-05-10 NOTE — Progress Notes (Signed)
Appt with Dr. Rhona Raider for right knee bipartite patella Monday 05/21/20 @ 4:45 pm, 4:15 pm arrival  [place], [place]

## 2020-05-13 ENCOUNTER — Other Ambulatory Visit: Payer: Self-pay | Admitting: Family Medicine

## 2020-05-28 ENCOUNTER — Telehealth: Payer: Self-pay | Admitting: Gastroenterology

## 2020-05-28 NOTE — Telephone Encounter (Signed)
Called patient to remind him of his procedure on 05-30-20.  He stated that someone called him and told him that his insurance would not cover procedure.( I could not find a phone note)  I explained [place] financial assistance program  however he did not want to do that.  He cancelled appointment.

## 2020-05-28 NOTE — Telephone Encounter (Signed)
This is unfortunate and the second time the patient has cancelled due to insurance issue. He should not reschedule until this is sorted out.  Amy can you please clarify what the problem is, did our staff call him to cancel this and has it been approved by his insurance. In February this was supposed to have been addressed? Thanks for any clarification

## 2020-05-29 NOTE — Telephone Encounter (Signed)
Dr. Havery Moros, All of my communications are in the ambulatory referral, and there is a referral audit trail that can be viewed as well with dates and times. On 04/17/20 2:57pm  I started the precert case that went into review and I uploaded records to the insurance portal. On 04/18/20 The case was still pending review.   On 04/19/20 at 7:14am I put in this note: "Case in MD review. Nurse Landry Dyke) notified that it may be best to cancel procedure until Josem Kaufmann is received from insurance company.  On 04/19/20 at 8:52am the procedure was cancelled, but was booked for 05/30/20. (I actually was not aware it was rebooked because I advised not to book until auth received) On 04/19/20 at 8:58am I put a note "Case denied.  Faxed denial to [facility] to see if Anderson [place] wants to do a peer to peer" This was after I sent a chat message to tell her it was denied.  So I sent a copy of the denial so that they would have the info in hand. Since then, I haven't had any more communication back about a peer to peer. After receiving this note, I did send a message to [place] telling her where to find my communication notes.

## 2020-05-29 NOTE — Telephone Encounter (Signed)
Amy thanks very much for clarifying this, did not realize what had happened.   What is the next step for this patient to schedule then? If I understand correctly the patient should not be scheduled until this is clarified with his insurance?

## 2020-05-30 ENCOUNTER — Encounter: Payer: Managed Care, Other (non HMO) | Admitting: Gastroenterology

## 2020-05-30 NOTE — Telephone Encounter (Signed)
I have received the fax, I have placed this in Dr. Doyne Keel IN box for review.

## 2020-05-30 NOTE — Telephone Encounter (Signed)
Thanks [employer].  I reviewed the records.  Unfortunately his insurance is not allowing him to proceed with EGD unless he meets 1 of several criteria that they have outlined (which I disagree with).   Can you contact the patient to see how is was feeling?  He was placed on omeprazole in February, I want to see if this is helped him.  If his symptoms persist, his insurance states that we will if he has failed 4 weeks of PPI, which she would meet if symptoms persist.  If his symptoms have resolved and he feels well and no longer wishes to have an endoscopy and we can hold off, but would like to see him in follow-up to reassess.  If he continues to feel poorly we should resubmit to the insurance that he has failed PPI and the patient may need to initiate the appeal himself. Thanks

## 2020-05-30 NOTE — Telephone Encounter (Signed)
Lm on vm for patient to return call 

## 2020-05-30 NOTE — Telephone Encounter (Signed)
I am faxing the denial letter with instructions on peer to peer to [place]'s attn so that she can get with you on it. Colletta [place] will get it to her.

## 2020-06-01 NOTE — Telephone Encounter (Signed)
Lm on vm for patient to return call 

## 2020-06-01 NOTE — Telephone Encounter (Signed)
Okay thank you. I will see him in the office on 4/26 and discuss the matter further with him at that time.

## 2020-06-01 NOTE — Telephone Encounter (Signed)
Noted, thank you

## 2020-06-01 NOTE — Telephone Encounter (Signed)
Spoke with patient, he states that since he finished the Omeprazole he has not had any issues whatsoever, he states that he feels like it may just have been an episode. He states that he hasn't taken any Omeprazole since he finished the prescription because there were no refills on the prescription but has been doing fine. Patient still would like to have an idea of what caused his symptoms. He has been scheduled for a follow up for reassessment with Dr. Havery Moros on Tuesday, 06/19/20 at 8:10 AM. Patient had no other concerns at the end of the call.

## 2020-06-19 ENCOUNTER — Ambulatory Visit (INDEPENDENT_AMBULATORY_CARE_PROVIDER_SITE_OTHER): Payer: Managed Care, Other (non HMO) | Admitting: Gastroenterology

## 2020-06-19 ENCOUNTER — Other Ambulatory Visit (INDEPENDENT_AMBULATORY_CARE_PROVIDER_SITE_OTHER): Payer: Managed Care, Other (non HMO)

## 2020-06-19 ENCOUNTER — Encounter: Payer: Self-pay | Admitting: Gastroenterology

## 2020-06-19 VITALS — BP 120/70 | HR 85 | Ht 73.0 in | Wt 214.0 lb

## 2020-06-19 DIAGNOSIS — K760 Fatty (change of) liver, not elsewhere classified: Secondary | ICD-10-CM

## 2020-06-19 DIAGNOSIS — R1013 Epigastric pain: Secondary | ICD-10-CM

## 2020-06-19 LAB — H. PYLORI ANTIBODY, IGG: H Pylori IgG: POSITIVE — AB

## 2020-06-19 MED ORDER — GAVISCON 80-14.2 MG PO CHEW: CHEWABLE_TABLET | ORAL | Status: DC

## 2020-06-19 MED ORDER — FAMOTIDINE 20 MG PO TABS: 20.0000 mg | ORAL_TABLET | Freq: Every day | ORAL | Status: DC | PRN

## 2020-06-19 NOTE — Progress Notes (Signed)
HPI :  55 year old male here for follow-up visit for epigastric pain.  Please see prior notes for details of his case.  He was seen by Ellouise Newer in February.  On February 6 he states he was going to bed and noted some mild epigastric discomfort which he described as an aching sensation.  Occurred while watching TV eating snacks in his bed.  The pain gradually progressed overnight to a sharp severe discomfort and he could not tolerate it anymore and went to the emergency room the following morning.  He had a normal lipase and LFTs, mild elevation in his white blood cell count.  The ER performed a CT angio of the chest abdomen pelvis which did not show any acute process to cause his symptoms.  He also had a right upper quadrant ultrasound which showed no gallstones, normal gallbladder, hepatic steatosis.  He was given a GI cocktail and he states about 45 minutes later his pain resolved.  He did not have any radiation of this pain.  No chest pain or shortness of breath.  He denied any typical reflux symptoms around the episode.  Was not using any NSAIDs around this time.  He has not seen any blood in his stools, no bowel habit changes.  He was given a course of omeprazole 40 mg a day which he took for 1 week as well as Carafate and states his symptoms did not recur.  They lasted about 3 days until they resolved. He recalls a prior episode of very similar symptoms that occurred this past fall, but was much milder and did not last as long.  He was referred for an upper endoscopy after this experience, however his insurance company did not authorize the procedure as he did not meet their criteria for needing endoscopy.  This apparently was scheduled twice without having approval and canceled both times.  He states since his visit he is felt pretty well without any recurrence of symptoms.  He has no postprandial pain.  No nausea or vomiting.  Weight is stable, no dysphagia.  He denies any family history of  gastric cancer.  No history of PUD.  He uses NSAIDs very rarely for knee pain.  In regards to his hepatic steatosis he denies any alcohol use on a routine basis, drinks rarely.  His weight has been stable, currently weighs 214 pounds.  LFTs have been normal.  Colonoscopy - 12/04/2017 - 2 small polyps and diverticulosis.  Pathology showed tubular adenomas and repeat recommended in 5 years.  RUQ [place] 04/01/20 - IMPRESSION: 1. The gallbladder is normal. 2. Diffusely increased parenchymal echogenicity of the liver, which can be seen in fatty infiltration or other hepatocellular disease.  CT angio C/A/P - 04/01/20 - IMPRESSION: 1. Negative aorta aside from mild tortuosity. Minimal atherosclerosis in the chest, abdomen, and pelvis. 2. No acute or inflammatory process identified. Diverticulosis of the distal large bowel. Hepatic steatosis. Stable right kidney status post upper pole partial nephrectomy. 3. Duplicated, left-side SVC - normal variant.   Past Medical History:  Diagnosis Date  . Arthritis    right big toe  . Cancer ([place])   . H/O renal cell carcinoma   . Hyperlipemia 08/07  . Hypertension 09/06  . Migraine   . Psoriasis   . Renal stones   . Sore throat    no fever- began 10/12/12     Past Surgical History:  Procedure Laterality Date  . COLONOSCOPY    . MRA Head and Neck  05/18/09   Nml  . MRI Head  05/18/09   Multip nonspecific changes  . ROBOTIC ASSITED PARTIAL NEPHRECTOMY Right 10/18/2012   Procedure: ROBOTIC ASSITED PARTIAL NEPHRECTOMY;  Surgeon: Dutch Gray, MD;  Location: [facility];  Service: Urology;  Laterality: Right;   Family History  Problem Relation Age of Onset  . Hyperlipidemia Mother   . Heart disease Father        MI  . Hypertension Father   . Heart disease Paternal Grandfather        MI  . Alcohol abuse Other   . Diabetes Neg Hx   . Prostate cancer Neg Hx   . Colon cancer Neg Hx   . Rectal cancer Neg Hx   . Colon polyps Neg Hx   . Esophageal cancer  Neg Hx    Social History   Tobacco Use  . Smoking status: Never Smoker  . Smokeless tobacco: Never Used  Vaping Use  . Vaping Use: Never used  Substance Use Topics  . Alcohol use: Yes    Comment: < 5 week, sometimes none  . Drug use: No   Current Outpatient Medications  Medication Sig Dispense Refill  . clobetasol (TEMOVATE) 0.05 % external solution APPLY TO THE SCALP ONCE OR TWICE DAILY AS NEEDED 50 mL 6  . fenofibrate (TRICOR) 145 MG tablet TAKE 1 TABLET(145 MG) BY MOUTH DAILY 90 tablet 3  . fexofenadine (ALLEGRA) 180 MG tablet Take 180 mg by mouth daily as needed for allergies.    . fluocinonide (LIDEX) 0.05 % external solution APPLY TO AFFECTED AREA ONCE DAILY AS DIRECTED 60 mL 3  . fluticasone (FLONASE) 50 MCG/ACT nasal spray Place 1 spray into both nostrils daily as needed for allergies or rhinitis.    Marland Kitchen lisinopril (ZESTRIL) 20 MG tablet TAKE 1 TABLET BY MOUTH DAILY. MAKE APPOINTMENT FOR PHYSICAL EXAM BEFORE ANY REFILLS** 90 tablet 0  . loratadine (CLARITIN) 10 MG tablet Take 10 mg by mouth daily as needed for allergies.    . Multiple Vitamin (MULTIVITAMIN) tablet Take 1 tablet by mouth daily.    . niacin (NIASPAN) 1000 MG CR tablet TAKE 1 TABLET BY MOUTH AT BEDTIME 90 tablet 3   No current facility-administered medications for this visit.   No Known Allergies   Review of Systems: All systems reviewed and negative except where noted in HPI.   Lab Results  Component Value Date   WBC 13.0 (H) 04/01/2020   HGB 15.8 04/01/2020   HCT 46.9 04/01/2020   MCV 90.7 04/01/2020   PLT 296 04/01/2020    Lab Results  Component Value Date   ALT 19 04/01/2020   AST 27 04/01/2020   ALKPHOS 61 04/01/2020   BILITOT 0.6 04/01/2020    Lab Results  Component Value Date   CREATININE 1.34 (H) 04/01/2020   BUN 21 (H) 04/01/2020   NA 138 04/01/2020   K 3.8 04/01/2020   CL 103 04/01/2020   CO2 22 04/01/2020   Lab Results  Component Value Date   LIPASE 30 04/01/2020      Physical Exam: BP 120/70   Pulse 85   Ht 6\' 1"  (1.854 m)   Wt 214 lb (97.1 kg)   BMI 28.23 kg/m  Constitutional: Pleasant,well-developed, male in no acute distress. Abdominal: Soft, nondistended, nontender. There are no masses palpable.  Extremities: no edema Lymphadenopathy: No cervical adenopathy noted. Neurological: Alert and oriented to person place and time. Skin: Skin is warm and dry. No rashes noted. Psychiatric: Normal  mood and affect. Behavior is normal.   ASSESSMENT AND PLAN: 55 year old male here for reassessment following:  Epigastric pain Fatty liver  As above, 2 episodes of acute onset epigastric pain, the latter was quite severe that led to ED visit which led to extensive evaluation.  No clear etiology on CT angio of the chest abdomen pelvis, ultrasound shows no gallstones, lipase normal.  He responded well to GI cocktail, completed short course of omeprazole which resolved his symptoms.  Sounds like this is acid mediated, discussed that he could have had erosive esophagitis versus gastritis, versus peptic ulcer disease, versus other.  We offered EGD to help clarify the issue shortly after his episode however insurance would not approve this x2.  Fortunately he has had no recurrence of symptoms since we have seen him.  We discussed options.  We will hold off on endoscopy right now as he is doing well without any persistent symptoms, and that his insurance company will not cover it unless his symptoms recur.  I do think screening for H. pylori is reasonable in this situation to assess his risk for ulcers and gastritis.  Discussed options and we will send him for H. pylori IgG serology.  In the interim if he has recurrent pain acutely recommend he start his omeprazole which he has at home, although given this will take time to kick in, more acutely could take Gaviscon as needed or Pepcid to get quicker relief.  He will contact me if he has any recurrent symptoms.  Otherwise  I counseled him on fatty liver disease, risks for fibrosis and cirrhosis.  He is going to work on weight loss to try to get his weight under 200 pounds which I think is a good idea.  He should have his LFTs checked once yearly.  Plan: - H pylori IgG - If recurrent pain: resume omeprazole, and more acutely pepcid 20mg  PRN and gaviscon PRN - counseled on fatty liver, he will work on weight loss  Patient will contact me if symptoms recur.  [place] Cellar, MD [facility]

## 2020-06-19 NOTE — Patient Instructions (Addendum)
If you are age 54 or older, your body mass index should be between 23-30. Your Body mass index is 28.23 kg/m. If this is out of the aforementioned range listed, please consider follow up with your Primary Care Provider.  If you are age 60 or younger, your body mass index should be between 19-25. Your Body mass index is 28.23 kg/m. If this is out of the aformentioned range listed, please consider follow up with your Primary Care Provider.   Please go to the lab in the basement of our building to have lab work done as you leave today. Hit "B" for basement when you get on the elevator.  When the doors open the lab is on your left.  We will call you with the results. Thank you.  If your pain comes back resume omeprazole.    In addition, please purchase the following medications over the counter and take as directed as needed: Pepcid 20 mg (famotidine) Gaviscon   Thank you for entrusting me with your care and for choosing [facility], Dr. [place] Cellar

## 2020-06-20 ENCOUNTER — Other Ambulatory Visit: Payer: Self-pay

## 2020-06-20 DIAGNOSIS — A048 Other specified bacterial intestinal infections: Secondary | ICD-10-CM

## 2020-06-20 MED ORDER — METRONIDAZOLE 500 MG PO TABS: 500.0000 mg | ORAL_TABLET | Freq: Two times a day (BID) | ORAL | 0 refills | Status: AC

## 2020-06-20 MED ORDER — AMOXICILLIN 500 MG PO TABS: 1000.0000 mg | ORAL_TABLET | Freq: Two times a day (BID) | ORAL | 0 refills | Status: AC

## 2020-06-20 MED ORDER — CLARITHROMYCIN 500 MG PO TABS: 500.0000 mg | ORAL_TABLET | Freq: Two times a day (BID) | ORAL | 0 refills | Status: AC

## 2020-06-20 MED ORDER — OMEPRAZOLE 40 MG PO CPDR: 40.0000 mg | DELAYED_RELEASE_CAPSULE | Freq: Two times a day (BID) | ORAL | 0 refills | Status: DC

## 2020-08-02 ENCOUNTER — Telehealth: Payer: Self-pay

## 2020-08-02 NOTE — Telephone Encounter (Signed)
Lab reminder sent to patient via My Chart.  

## 2020-08-02 NOTE — Telephone Encounter (Signed)
-----   Message from Yevette Edwards, RN sent at 06/20/2020  9:29 AM EDT ----- Regarding: Lab H. Pylori stool antigen due after 08/02/20. Order in epic.

## 2020-08-11 ENCOUNTER — Other Ambulatory Visit: Payer: Self-pay | Admitting: Family Medicine

## 2020-10-03 ENCOUNTER — Other Ambulatory Visit: Payer: Managed Care, Other (non HMO)

## 2020-10-03 DIAGNOSIS — A048 Other specified bacterial intestinal infections: Secondary | ICD-10-CM

## 2020-10-06 LAB — H. PYLORI ANTIGEN, STOOL: H pylori Ag, Stl: NEGATIVE

## 2020-11-04 ENCOUNTER — Other Ambulatory Visit: Payer: Self-pay | Admitting: Family Medicine

## 2020-11-08 ENCOUNTER — Ambulatory Visit: Payer: Managed Care, Other (non HMO) | Admitting: Physician Assistant

## 2020-11-09 ENCOUNTER — Other Ambulatory Visit: Payer: Self-pay | Admitting: Family Medicine

## 2020-11-28 ENCOUNTER — Other Ambulatory Visit (INDEPENDENT_AMBULATORY_CARE_PROVIDER_SITE_OTHER): Payer: Managed Care, Other (non HMO)

## 2020-11-28 ENCOUNTER — Encounter: Payer: Self-pay | Admitting: Gastroenterology

## 2020-11-28 ENCOUNTER — Ambulatory Visit (INDEPENDENT_AMBULATORY_CARE_PROVIDER_SITE_OTHER): Payer: Managed Care, Other (non HMO) | Admitting: Gastroenterology

## 2020-11-28 VITALS — BP 130/80 | HR 68 | Ht 73.0 in | Wt 212.0 lb

## 2020-11-28 DIAGNOSIS — R109 Unspecified abdominal pain: Secondary | ICD-10-CM | POA: Diagnosis not present

## 2020-11-28 DIAGNOSIS — R1013 Epigastric pain: Secondary | ICD-10-CM | POA: Diagnosis not present

## 2020-11-28 DIAGNOSIS — R140 Abdominal distension (gaseous): Secondary | ICD-10-CM

## 2020-11-28 DIAGNOSIS — K9049 Malabsorption due to intolerance, not elsewhere classified: Secondary | ICD-10-CM

## 2020-11-28 LAB — CBC WITH DIFFERENTIAL/PLATELET
Basophils Absolute: 0 10*3/uL (ref 0.0–0.1)
Basophils Relative: 0.4 % (ref 0.0–3.0)
Eosinophils Absolute: 0.1 10*3/uL (ref 0.0–0.7)
Eosinophils Relative: 1 % (ref 0.0–5.0)
HCT: 46.3 % (ref 39.0–52.0)
Hemoglobin: 15.9 g/dL (ref 13.0–17.0)
Lymphocytes Relative: 32.5 % (ref 12.0–46.0)
Lymphs Abs: 3 10*3/uL (ref 0.7–4.0)
MCHC: 34.4 g/dL (ref 30.0–36.0)
MCV: 90.9 fl (ref 78.0–100.0)
Monocytes Absolute: 1.3 10*3/uL — ABNORMAL HIGH (ref 0.1–1.0)
Monocytes Relative: 14.1 % — ABNORMAL HIGH (ref 3.0–12.0)
Neutro Abs: 4.8 10*3/uL (ref 1.4–7.7)
Neutrophils Relative %: 52 % (ref 43.0–77.0)
Platelets: 258 10*3/uL (ref 150.0–400.0)
RBC: 5.09 Mil/uL (ref 4.22–5.81)
RDW: 12.8 % (ref 11.5–15.5)
WBC: 9.3 10*3/uL (ref 4.0–10.5)

## 2020-11-28 LAB — COMPREHENSIVE METABOLIC PANEL
ALT: 11 U/L (ref 0–53)
AST: 17 U/L (ref 0–37)
Albumin: 4.1 g/dL (ref 3.5–5.2)
Alkaline Phosphatase: 80 U/L (ref 39–117)
BUN: 18 mg/dL (ref 6–23)
CO2: 27 mEq/L (ref 19–32)
Calcium: 9.4 mg/dL (ref 8.4–10.5)
Chloride: 105 mEq/L (ref 96–112)
Creatinine, Ser: 1.33 mg/dL (ref 0.40–1.50)
GFR: 60.76 mL/min (ref >60.00)
Glucose, Bld: 95 mg/dL (ref 70–99)
Potassium: 4.1 mEq/L (ref 3.5–5.1)
Sodium: 141 mEq/L (ref 135–145)
Total Bilirubin: 0.5 mg/dL (ref 0.2–1.2)
Total Protein: 7 g/dL (ref 6.0–8.3)

## 2020-11-28 NOTE — Patient Instructions (Addendum)
If you are age 54 or older, your body mass index should be between 23-30. Your Body mass index is 27.97 kg/m. If this is out of the aforementioned range listed, please consider follow up with your Primary Care Provider.  If you are age 60 or younger, your body mass index should be between 19-25. Your Body mass index is 27.97 kg/m. If this is out of the aformentioned range listed, please consider follow up with your Primary Care Provider.   __________________________________________________________  The [facility] providers would like to encourage you to use [facility] to communicate with providers for non-urgent requests or questions.  Due to long hold times on the telephone, sending your provider a message by [facility] may be a faster and more efficient way to get a response.  Please allow 48 business hours for a response.  Please remember that this is for non-urgent requests.   Please go to the lab in the basement of our building to have lab work done as you leave today. Hit "B" for basement when you get on the elevator.  When the doors open the lab is on your left.  We will call you with the results. Thank you.  Thank you for entrusting me with your care and for choosing [facility], Dr. [place] Cellar

## 2020-11-28 NOTE — Progress Notes (Signed)
HPI :  55 year old male here for follow-up visit regarding abdominal pains.  See prior notes for details of his case.  Recall that he has had a few episodes of severe epigastric discomfort that led to an ED visit.  He had normal labs and normal CT angio of the chest abdomen pelvis which did not show any acute process to cause his symptoms previously. He also had a right upper quadrant ultrasound which showed no gallstones, normal gallbladder, hepatic steatosis.  He did have a trial of antacid at the time which she thought may have helped.  We had discussed doing an upper endoscopy at his last visit however his insurance company did not authorize the procedure.   Since his last visit I screen him for H. pylori with an IgG test which was positive. Led to therapy with the following treatment regimen for 14 days: Amoxicillin 1gm BID Clarithromycin 500mg  BID Flagyl 500mg  BID Omeprazole 40mg  BID   He completed the therapy and then had a negative H. pylori stool antigen test on August 10.  He states he really has not had any further episodes of epigastric discomfort since he has been treated for this.  Generally has been doing well in that regard.  Main concern for him at this point time is a new pain that is at the bottom of his right rib cage which is lateral to his umbilicus on the right side/flank.  He states he may feel this once every other day, it is mild and feels that less than an hour at a time.  He denies any positional component to this, does not feel to be related to when he eats at all.  Seems to be sporadic.  No significant pain but feels like a mild nuisance or discomfort.  The other symptom he has is that he feels more bloated and gassy recently.  He does think that beer will reliably make him feel bloated every time he drinks it.  He does not have this frequently and other alcohol does not bother him.  He does have an occasional loose stool and states he is rather sensitive to lactose as  well and tries to avoid milk.  He is otherwise New medications.  He is not taking omeprazole or Pepcid at baseline.  He denies any reflux at this time, no regurgitation.  No dysphagia.  No nausea or vomiting.  No routine postprandial symptoms.  He has social alcohol use.  He has been trying to lose weight over time and then doing pretty well with this.  He does have a history of renal cell carcinoma and is concerned about this in light of his right-sided pain.  His CT scan in February of this year did not show any concerning findings in that area.   Prior workup: Colonoscopy - 12/04/2017 - 2 small polyps and diverticulosis.  Pathology showed tubular adenomas and repeat recommended in 5 years.   RUQ [place] 04/01/20 - IMPRESSION: 1. The gallbladder is normal. 2. Diffusely increased parenchymal echogenicity of the liver, which can be seen in fatty infiltration or other hepatocellular disease.   CT angio C/A/P - 04/01/20 - IMPRESSION: 1. Negative aorta aside from mild tortuosity. Minimal atherosclerosis in the chest, abdomen, and pelvis. 2. No acute or inflammatory process identified. Diverticulosis of the distal large bowel. Hepatic steatosis. Stable right kidney status post upper pole partial nephrectomy. 3. Duplicated, left-side SVC - normal variant.     Past Medical History:  Diagnosis Date   Arthritis  right big toe   Cancer ([place])    H. pylori infection    H/O renal cell carcinoma    Hyperlipemia 09/2005   Hypertension 10/2004   Migraine    Psoriasis    Renal stones    Sore throat    no fever- began 10/12/12     Past Surgical History:  Procedure Laterality Date   COLONOSCOPY     MRA Head and Neck  05/18/09   Nml   MRI Head  05/18/09   Multip nonspecific changes   ROBOTIC ASSITED PARTIAL NEPHRECTOMY Right 10/18/2012   Procedure: ROBOTIC ASSITED PARTIAL NEPHRECTOMY;  Surgeon: Dutch Gray, MD;  Location: [facility];  Service: Urology;  Laterality: Right;   Family History  Problem  Relation Age of Onset   Hyperlipidemia Mother    Heart disease Father        MI   Hypertension Father    Heart disease Paternal Grandfather        MI   Alcohol abuse Other    Diabetes Neg Hx    Prostate cancer Neg Hx    Colon cancer Neg Hx    Rectal cancer Neg Hx    Colon polyps Neg Hx    Esophageal cancer Neg Hx    Social History   Tobacco Use   Smoking status: Never   Smokeless tobacco: Never  Vaping Use   Vaping Use: Never used  Substance Use Topics   Alcohol use: Yes    Comment: < 5 week, sometimes none   Drug use: No   Current Outpatient Medications  Medication Sig Dispense Refill   Alum Hydroxide-Mag Trisilicate (GAVISCON) 57-32.2 MG CHEW Take as directed as needed 224 tablet    clobetasol (TEMOVATE) 0.05 % external solution APPLY TO THE SCALP ONCE OR TWICE DAILY AS NEEDED 50 mL 6   famotidine (PEPCID) 20 MG tablet Take 1 tablet (20 mg total) by mouth daily as needed for heartburn or indigestion.     fenofibrate (TRICOR) 145 MG tablet TAKE 1 TABLET(145 MG) BY MOUTH DAILY 90 tablet 3   fexofenadine (ALLEGRA) 180 MG tablet Take 180 mg by mouth daily as needed for allergies.     fluocinonide (LIDEX) 0.05 % external solution APPLY TOPICALLY TO THE AFFECTED AREA EVERY DAY AS DIRECTED 60 mL 1   fluticasone (FLONASE) 50 MCG/ACT nasal spray Place 1 spray into both nostrils daily as needed for allergies or rhinitis.     lisinopril (ZESTRIL) 20 MG tablet TAKE 1 TABLET BY MOUTH DAILY 90 tablet 0   loratadine (CLARITIN) 10 MG tablet Take 10 mg by mouth daily as needed for allergies.     Multiple Vitamin (MULTIVITAMIN) tablet Take 1 tablet by mouth daily.     niacin (NIASPAN) 1000 MG CR tablet TAKE 1 TABLET BY MOUTH AT BEDTIME 90 tablet 3   omeprazole (PRILOSEC) 40 MG capsule Take 1 capsule (40 mg total) by mouth in the morning and at bedtime for 14 days. 28 capsule 0   No current facility-administered medications for this visit.   No Known Allergies   Review of Systems: All  systems reviewed and negative except where noted in HPI.   Lab Results  Component Value Date   WBC 9.3 11/28/2020   HGB 15.9 11/28/2020   HCT 46.3 11/28/2020   MCV 90.9 11/28/2020   PLT 258.0 11/28/2020    Lab Results  Component Value Date   CREATININE 1.33 11/28/2020   BUN 18 11/28/2020   NA 141 11/28/2020  K 4.1 11/28/2020   CL 105 11/28/2020   CO2 27 11/28/2020    Lab Results  Component Value Date   ALT 11 11/28/2020   AST 17 11/28/2020   ALKPHOS 80 11/28/2020   BILITOT 0.5 11/28/2020     Physical Exam: BP 130/80   Pulse 68   Ht 6\' 1"  (1.854 m)   Wt 212 lb (96.2 kg)   BMI 27.97 kg/m  Constitutional: Pleasant,well-developed, male in no acute distress. HEENT: Normocephalic and atraumatic. Conjunctivae are normal. No scleral icterus. Neck supple.  Abdominal: Soft, nondistended, nontender. Negative carnett - can't reproduce symptoms. There are no masses palpable.  Extremities: no edema Lymphadenopathy: No cervical adenopathy noted. Neurological: Alert and oriented to person place and time. Skin: Skin is warm and dry. No rashes noted. Psychiatric: Normal mood and affect. Behavior is normal.   ASSESSMENT AND PLAN: 55 year old male here for reassessment of the following:  Epigastric pain Pain along the right costal margin -mid abdomen Bloating Food intolerance - dairy/beer  As above, a few discrete episodes of severe epigastric pain with work-up as outlined.  EGD at the time not done as his insurance would not approve it.  I screened him for H. pylori, he was positive, I treated him with regimen as above and follow-up eradication testing was negative.  He has had no further episodes of epigastric pain since this and generally doing well.  Do not think he needs an EGD at this point as his prior upper tract symptoms have resolved.  He has focal intermittent.  Along the right lower costal margin which is lateral to his umbilicus.  He had a CT scan in February which  was unremarkable, his kidney looked okay.  He does not have any clear precipitants for this.  Possible musculoskeletal although I cannot reproduce it on exam and positional changes do not seem to make a difference.  Discussed how aggressive he wants to be with this in regards to work-up.  I sent some basic labs today which show CBC and LFTs are normal.  If this really bothersome or worsens we can repeat a CT scan but he is cognizant of the radiation wants to avoid that if possible.  Monitor for now and if this worsens he will let me know.  In regards to his bloating, could just be food intolerance but given his intolerance to dairy and beer we will screen him for celiac disease to make sure okay.  He will avoid beer if that reliably makes him feel poorly.  Plan: - H pylori eradicated, upper abdominal pain resolved, no need for EGD, will monitor call for recurrence - unclear what is causing R mid sided pain could be musculoskeletal, prior CT looked okay. Will do CBC and CMET. If worsening consider repeat CT, he wants to hold off on that for now - food intolerance - will screen for celiac disease, avoid beer if that is a trigger  Jolly Mango, MD [facility]

## 2020-11-29 LAB — TISSUE TRANSGLUTAMINASE, IGA: (tTG) Ab, IgA: <1 U/mL

## 2020-11-29 LAB — IGA: Immunoglobulin A: 435 mg/dL — ABNORMAL HIGH (ref 47–310)

## 2020-12-21 ENCOUNTER — Telehealth: Payer: Self-pay | Admitting: Gastroenterology

## 2020-12-21 DIAGNOSIS — R1013 Epigastric pain: Secondary | ICD-10-CM

## 2020-12-21 NOTE — Telephone Encounter (Signed)
Returned call to patient, he reports that he has been having the same symptoms that he had when he first came here. Patient reports that on Friday he had pain in his upper abdomen just below his ribcage, he states that the pain was so bad that he almost went to an urgent care or ED. Pt reports that he had the same pain last night. Pt does not recognize any food triggers. He states that he continues to drink coffee with no issues. Pt reports that he takes Pepcid 20 mg daily before breakfast. Pt wonders if he needs EGD at this time. Please advise, thanks

## 2020-12-21 NOTE — Telephone Encounter (Signed)
Patient called stating he has had two more episodes, one making him go to urgent care. I think he is ready to proceed with EGD.  Does he need an office visit first or can he go ahead and be set up for the procedure?  Please call and advise.  Thank you.

## 2020-12-21 NOTE — Telephone Encounter (Signed)
Spoke with patient in regards to recommendations, he wants to proceed with EGD at this time. Per protocol no pre-visit appt needed since patient was seen on 11/28/20. Patient has been scheduled for an EGD in the [place] with Dr. Havery Moros on Wednesday, 01/02/21 at 10 am. Pt is aware that he will need to arrive on the 4th floor by 9 am with a care partner. Pt states that he has access to his my chart, advised that I will send his instructions there. Pt verbalized understanding and had no concerns at the end of the call.  Ambulatory referral to GI in epic.

## 2020-12-21 NOTE — Telephone Encounter (Signed)
Sorry to hear this.  The last time I saw him earlier this month he states those pains had resolved and he was having more discomfort in his lower abdomen to the umbilicus.  If he has epigastric pain and it is his epigastric pain that has recurred, then yes the next step is an upper endoscopy.  If that is the case please schedule him for first available at the [facility].  Thanks

## 2021-01-01 ENCOUNTER — Encounter: Payer: Self-pay | Admitting: Gastroenterology

## 2021-01-02 ENCOUNTER — Other Ambulatory Visit: Payer: Self-pay

## 2021-01-02 ENCOUNTER — Encounter: Payer: Self-pay | Admitting: Gastroenterology

## 2021-01-02 ENCOUNTER — Ambulatory Visit (AMBULATORY_SURGERY_CENTER): Payer: Managed Care, Other (non HMO) | Admitting: Gastroenterology

## 2021-01-02 VITALS — BP 127/91 | HR 68 | Temp 97.0°F | Resp 14 | Ht 73.0 in | Wt 212.0 lb

## 2021-01-02 DIAGNOSIS — K229 Disease of esophagus, unspecified: Secondary | ICD-10-CM

## 2021-01-02 DIAGNOSIS — K449 Diaphragmatic hernia without obstruction or gangrene: Secondary | ICD-10-CM | POA: Diagnosis not present

## 2021-01-02 DIAGNOSIS — K3189 Other diseases of stomach and duodenum: Secondary | ICD-10-CM | POA: Diagnosis not present

## 2021-01-02 DIAGNOSIS — R1013 Epigastric pain: Secondary | ICD-10-CM | POA: Diagnosis present

## 2021-01-02 MED ORDER — LIDOCAINE VISCOUS HCL 2 % MT SOLN: OROMUCOSAL | 0 refills | Status: DC

## 2021-01-02 MED ORDER — SODIUM CHLORIDE 0.9 % IV SOLN: 500.0000 mL | Freq: Once | INTRAVENOUS | Status: DC

## 2021-01-02 MED ORDER — OMEPRAZOLE 40 MG PO CPDR: 40.0000 mg | DELAYED_RELEASE_CAPSULE | Freq: Every day | ORAL | 1 refills | Status: DC

## 2021-01-02 NOTE — Progress Notes (Signed)
[facility] History and Physical   Primary Care Physician:  Tonia Ghent, MD   Reason for Procedure:   Epigastric pain  Plan:    EGD     HPI: Parker Ryan is a 55 y.o. male  here for EGD to evaluate epigastric pain. Intermittent severe episodes of epigastric pain. RUQ [place], CT C/A/P unremarkable in the setting of pain. No reflux. He has had treatment for H pylori. Was feeling better but 2 recurrent episodes since I have last seen him, states lidocaine in urgent care took it away. Has not been using antacids routinely.. Patient denies any bowel symptoms at this time. Otherwise feels well without any cardiopulmonary symptoms.    Past Medical History:  Diagnosis Date   Arthritis    right big toe   Cancer ([place])    H. pylori infection    H/O renal cell carcinoma    Hyperlipemia 09/2005   Hypertension 10/2004   Migraine    Psoriasis    Renal stones    Sore throat    no fever- began 10/12/12    Past Surgical History:  Procedure Laterality Date   COLONOSCOPY     MRA Head and Neck  05/18/09   Nml   MRI Head  05/18/09   Multip nonspecific changes   ROBOTIC ASSITED PARTIAL NEPHRECTOMY Right 10/18/2012   Procedure: ROBOTIC ASSITED PARTIAL NEPHRECTOMY;  Surgeon: Dutch Gray, MD;  Location: [facility];  Service: Urology;  Laterality: Right;    Prior to Admission medications   Medication Sig Start Date End Date Taking? Authorizing Provider  Alum Hydroxide-Mag Trisilicate (GAVISCON) 49-70.2 MG CHEW Take as directed as needed 06/19/20  Yes Kadey Mihalic, Carlota Raspberry, MD  clobetasol (TEMOVATE) 0.05 % external solution APPLY TO THE SCALP ONCE OR TWICE DAILY AS NEEDED 11/29/19  Yes Tonia Ghent, MD  famotidine (PEPCID) 20 MG tablet Take 1 tablet (20 mg total) by mouth daily as needed for heartburn or indigestion. 06/19/20  Yes Avant Printy, Carlota Raspberry, MD  fenofibrate (TRICOR) 145 MG tablet TAKE 1 TABLET(145 MG) BY MOUTH DAILY 12/22/19  Yes Tonia Ghent, MD  fluocinonide (LIDEX)  0.05 % external solution APPLY TOPICALLY TO THE AFFECTED AREA EVERY DAY AS DIRECTED 11/06/20  Yes Tonia Ghent, MD  lisinopril (ZESTRIL) 20 MG tablet TAKE 1 TABLET BY MOUTH DAILY 11/09/20  Yes Tonia Ghent, MD  niacin (NIASPAN) 1000 MG CR tablet TAKE 1 TABLET BY MOUTH AT BEDTIME 02/13/20  Yes Tonia Ghent, MD  fexofenadine (ALLEGRA) 180 MG tablet Take 180 mg by mouth daily as needed for allergies.    [provider]  fluticasone (FLONASE) 50 MCG/ACT nasal spray Place 1 spray into both nostrils daily as needed for allergies or rhinitis.    [provider]  loratadine (CLARITIN) 10 MG tablet Take 10 mg by mouth daily as needed for allergies.    [provider]  Multiple Vitamin (MULTIVITAMIN) tablet Take 1 tablet by mouth daily.    [provider]  omeprazole (PRILOSEC) 40 MG capsule Take 1 capsule (40 mg total) by mouth in the morning and at bedtime for 14 days. 06/20/20 07/04/20  Zayonna Ayuso, Carlota Raspberry, MD  sucralfate (CARAFATE) 1 g tablet Take 1 g by mouth 4 (four) times daily. 12/14/20   [provider]    Current Outpatient Medications  Medication Sig Dispense Refill   Alum Hydroxide-Mag Trisilicate (GAVISCON) 63-78.5 MG CHEW Take as directed as needed 224 tablet    clobetasol (TEMOVATE) 0.05 % external  solution APPLY TO THE SCALP ONCE OR TWICE DAILY AS NEEDED 50 mL 6   famotidine (PEPCID) 20 MG tablet Take 1 tablet (20 mg total) by mouth daily as needed for heartburn or indigestion.     fenofibrate (TRICOR) 145 MG tablet TAKE 1 TABLET(145 MG) BY MOUTH DAILY 90 tablet 3   fluocinonide (LIDEX) 0.05 % external solution APPLY TOPICALLY TO THE AFFECTED AREA EVERY DAY AS DIRECTED 60 mL 1   lisinopril (ZESTRIL) 20 MG tablet TAKE 1 TABLET BY MOUTH DAILY 90 tablet 0   niacin (NIASPAN) 1000 MG CR tablet TAKE 1 TABLET BY MOUTH AT BEDTIME 90 tablet 3   fexofenadine (ALLEGRA) 180 MG tablet Take 180 mg by mouth daily as needed for allergies.      fluticasone (FLONASE) 50 MCG/ACT nasal spray Place 1 spray into both nostrils daily as needed for allergies or rhinitis.     loratadine (CLARITIN) 10 MG tablet Take 10 mg by mouth daily as needed for allergies.     Multiple Vitamin (MULTIVITAMIN) tablet Take 1 tablet by mouth daily.     omeprazole (PRILOSEC) 40 MG capsule Take 1 capsule (40 mg total) by mouth in the morning and at bedtime for 14 days. 28 capsule 0   sucralfate (CARAFATE) 1 g tablet Take 1 g by mouth 4 (four) times daily.     Current Facility-Administered Medications  Medication Dose Route Frequency Provider Last Rate Last Admin   0.9 %  sodium chloride infusion  500 mL Intravenous Once Zavier Canela, Carlota Raspberry, MD        Allergies as of 01/02/2021   (No Known Allergies)    Family History  Problem Relation Age of Onset   Hyperlipidemia Mother    Heart disease Father        MI   Hypertension Father    Heart disease Paternal Grandfather        MI   Alcohol abuse Other    Diabetes Neg Hx    Prostate cancer Neg Hx    Colon cancer Neg Hx    Rectal cancer Neg Hx    Colon polyps Neg Hx    Esophageal cancer Neg Hx     Social History   Socioeconomic History   Marital status: Married    Spouse name: Not on file   Number of children: 2   Years of education: Not on file   Highest education level: Not on file  Occupational History   Occupation: Engineer, civil (consulting), Chief Financial Officer    Comment: [employer]  Tobacco Use   Smoking status: Never   Smokeless tobacco: Never  Vaping Use   Vaping Use: Never used  Substance and Sexual Activity   Alcohol use: Yes    Comment: < 5 week, sometimes none   Drug use: No   Sexual activity: Not on file  Other Topics Concern   Not on file  Social History Narrative   Married, 1996   Children: 2 step children   Cowboys fan   Occupation: Engineer, civil (consulting), ENGINEER   [employer] grad, Technical sales engineer   Enjoys riding track motorbike/sport bike   Social Determinants of Adult nurse Strain: Not on file  Food Insecurity: Not on file  Transportation Needs: Not on file  Physical Activity: Not on file  Stress: Not on file  Social Connections: Not on file  Intimate Partner Violence: Not on file    Review of Systems: All other review of systems negative except as mentioned  in the HPI.  Physical Exam: Vital signs BP 123/74   Pulse 77   Temp (!) 97 F (36.1 C) (Temporal)   Ht 6\' 1"  (1.854 m)   Wt 212 lb (96.2 kg)   SpO2 96%   BMI 27.97 kg/m   General:   Alert,  Well-developed, pleasant and cooperative in NAD Lungs:  Clear throughout to auscultation.   Heart:  Regular rate and rhythm Abdomen:  Soft, nontender and nondistended.   Neuro/Psych:  Alert and cooperative. Normal mood and affect. A and O x 3  Jolly Mango, MD [facility]

## 2021-01-02 NOTE — Progress Notes (Signed)
Pt's states no medical or surgical changes since previsit or office visit.   Cw vitals and Sm IV.

## 2021-01-02 NOTE — Progress Notes (Signed)
Called to room to assist during endoscopic procedure.  Patient ID and intended procedure confirmed with present staff. Received instructions for my participation in the procedure from the performing physician.  

## 2021-01-02 NOTE — Progress Notes (Signed)
V.O LIDOCAINE 15ML 2% AS NEEDED 1-2 TIMES DAILY  ADMINISTER 100 ML.  OMPERAZOLE 40 MG DISPENSE 90 WITH ONE REFILL.

## 2021-01-02 NOTE — Op Note (Signed)
[facility] Patient Name: Parker Ryan Procedure Date: 01/02/2021 9:53 AM MRN: [id number] Endoscopist: Remo Lipps P. Havery Moros , MD Age: 55 Referring MD:  Date of Birth: 10/01/1965 Gender: Male Account #: [id number] Procedure:                Upper GI endoscopy Indications:              Episodic severe lower sternal / epigastric                            abdominal pain - negative RUQ [place], CT angio C/A/P.                            History of H pylori s/p eradication. Patient had                            been doing better but 2 recurrent episodes in                            recent weeks, he reports relieved with viscous                            lidocaine Medicines:                Monitored Anesthesia Care Procedure:                Pre-Anesthesia Assessment:                           - Prior to the procedure, a History and Physical                            was performed, and patient medications and                            allergies were reviewed. The patient's tolerance of                            previous anesthesia was also reviewed. The risks                            and benefits of the procedure and the sedation                            options and risks were discussed with the patient.                            All questions were answered, and informed consent                            was obtained. Prior Anticoagulants: The patient has                            taken no previous anticoagulant or antiplatelet  agents. ASA Grade Assessment: II - A patient with                            mild systemic disease. After reviewing the risks                            and benefits, the patient was deemed in                            satisfactory condition to undergo the procedure.                           After obtaining informed consent, the endoscope was                            passed under direct vision. Throughout the                             procedure, the patient's blood pressure, pulse, and                            oxygen saturations were monitored continuously. The                            GIF [id number] #[id number] was introduced through the                            mouth, and advanced to the second part of duodenum.                            The upper GI endoscopy was accomplished without                            difficulty. The patient tolerated the procedure                            well. Scope In: Scope Out: Findings:                 Esophagogastric landmarks were identified: the                            Z-line was found at 40 cm, the gastroesophageal                            junction was found at 40 cm and the upper extent of                            the gastric folds was found at 41 cm from the                            incisors.                           A  1 cm hiatal hernia was present.                           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 Biopsies were                            taken with a cold forceps for histology and to                            remove it.                           The exam of the esophagus was otherwise normal.                           The entire examined stomach was normal. Biopsies                            were taken with a cold forceps for Helicobacter                            pylori testing.                           The duodenal bulb and second portion of the                            duodenum were normal. Complications:            No immediate complications. Estimated blood loss:                            Minimal. Estimated Blood Loss:     Estimated blood loss was minimal. Impression:               - Esophagogastric landmarks identified.                           - 1 cm hiatal hernia.                            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 Removed with forceps.                           - Normal stomach. Biopsied to check for H pylori  eradication in light of recurrent symptoms.                           - Normal duodenal bulb and second portion of the                            duodenum.                           No clear cause on this exam. Unclear if patient                            experienced episodic reflux at the time causing                            this vs. esophageal spasm. Given relief with                            viscous lidocaine in urgent care, would recommend                            omeprazole 40mg  / day trial for a few months to see                            if this can prevent episodes. If this works can                            slowly taper over time. If episodes continue                            despite this consider treatment for esophageal                            spasm. Can give viscous lidocaine to abort episodes                            if that helps. Recommendation:           - Patient has a contact number available for                            emergencies. The signs and symptoms of potential                            delayed complications were discussed with the                            patient. Return to normal activities tomorrow.                            Written discharge instructions were provided to the                            patient.                           -  Resume previous diet.                           - Continue present medications.                           - Trial of omeprazole 40mg  / day                           - Await pathology results. Remo Lipps P. Trinette Vera, MD 01/02/2021 10:19:08 AM This report has been signed electronically.

## 2021-01-02 NOTE — Patient Instructions (Signed)
YOU HAD AN ENDOSCOPIC PROCEDURE TODAY AT [facility]:   Refer to the procedure report that was given to you for any specific questions about what was found during the examination.  If the procedure report does not answer your questions, please call your gastroenterologist to clarify.  If you requested that your care partner not be given the details of your procedure findings, then the procedure report has been included in a sealed envelope for you to review at your convenience later.  YOU SHOULD EXPECT: Some feelings of bloating in the abdomen. Passage of more gas than usual.  Walking can help get rid of the air that was put into your GI tract during the procedure and reduce the bloating. If you had a lower endoscopy (such as a colonoscopy or flexible sigmoidoscopy) you may notice spotting of blood in your stool or on the toilet paper. If you underwent a bowel prep for your procedure, you may not have a normal bowel movement for a few days.  Please Note:  You might notice some irritation and congestion in your nose or some drainage.  This is from the oxygen used during your procedure.  There is no need for concern and it should clear up in a day or so.  SYMPTOMS TO REPORT IMMEDIATELY:  Following upper endoscopy (EGD)  Vomiting of blood or coffee ground material  New chest pain or pain under the shoulder blades  Painful or persistently difficult swallowing  New shortness of breath  Fever of 100F or higher  Black, tarry-looking stools  For urgent or emergent issues, a gastroenterologist can be reached at any hour by calling [phone]. Do not use MyChart messaging for urgent concerns.    DIET:  We do recommend a small meal at first, but then you may proceed to your regular diet.  Drink plenty of fluids but you should avoid alcoholic beverages for 24 hours.  ACTIVITY:  You should plan to take it easy for the rest of today and you should NOT DRIVE or use heavy machinery until  tomorrow (because of the sedation medicines used during the test).    FOLLOW UP: Our staff will call the number listed on your records 48-72 hours following your procedure to check on you and address any questions or concerns that you may have regarding the information given to you following your procedure. If we do not reach you, we will leave a message.  We will attempt to reach you two times.  During this call, we will ask if you have developed any symptoms of COVID 19. If you develop any symptoms (ie: fever, flu-like symptoms, shortness of breath, cough etc.) before then, please call [phone].  If you test positive for Covid 19 in the 2 weeks post procedure, please call and report this information to [place].    If any biopsies were taken you will be contacted by phone or by letter within the next 1-3 weeks.  Please call us at [phone] if you have not heard about the biopsies in 3 weeks.    SIGNATURES/CONFIDENTIALITY: You and/or your care partner have signed paperwork which will be entered into your electronic medical record.  These signatures attest to the fact that that the information above on your After Visit Summary has been reviewed and is understood.  Full responsibility of the confidentiality of this discharge information lies with you and/or your care-partner.    Resume medications. Information given on [facility].

## 2021-01-02 NOTE — Progress Notes (Signed)
Pt stable to RR 

## 2021-01-03 ENCOUNTER — Telehealth: Payer: Self-pay | Admitting: Gastroenterology

## 2021-01-03 NOTE — Telephone Encounter (Signed)
Inbound call from pharmacy requesting a call back stating the need to know the dosage amount on the medication Lidocaine. Please advise. Thank you.

## 2021-01-04 ENCOUNTER — Telehealth: Payer: Self-pay | Admitting: *Deleted

## 2021-01-04 NOTE — Telephone Encounter (Signed)
  Follow up Call-  Call back number 01/02/2021  Post procedure Call Back phone  # [phone]  Permission to leave phone message Yes  Some recent data might be hidden     Patient questions:  Do you have a fever, pain , or abdominal swelling? No. Pain Score  0 *  Have you tolerated food without any problems? Yes.    Have you been able to return to your normal activities? Yes.    Do you have any questions about your discharge instructions: Diet   No. Medications  No. Follow up visit  No.  Do you have questions or concerns about your Care? No.  Actions: * If pain score is 4 or above: No action needed, pain <4.  Have you developed a fever since your procedure? no  2.   Have you had an respiratory symptoms (SOB or cough) since your procedure? no  3.   Have you tested positive for COVID 19 since your procedure no  4.   Have you had any family members/close contacts diagnosed with the COVID 19 since your procedure?  no   If yes to any of these questions please route to Joylene John, RN and Joella Prince, RN

## 2021-01-09 NOTE — Telephone Encounter (Signed)
Call placed to [facility] pharmacy to give clarification of rx,spoke with [facility] and she stated "we received clarification already on the 9th".

## 2021-01-21 ENCOUNTER — Encounter: Payer: Self-pay | Admitting: Family Medicine

## 2021-01-21 ENCOUNTER — Other Ambulatory Visit: Payer: Self-pay

## 2021-01-21 ENCOUNTER — Telehealth (INDEPENDENT_AMBULATORY_CARE_PROVIDER_SITE_OTHER): Payer: Managed Care, Other (non HMO) | Admitting: Family Medicine

## 2021-01-21 DIAGNOSIS — I10 Essential (primary) hypertension: Secondary | ICD-10-CM | POA: Diagnosis not present

## 2021-01-21 MED ORDER — AMLODIPINE BESYLATE 5 MG PO TABS: 5.0000 mg | ORAL_TABLET | Freq: Every day | ORAL | 1 refills | Status: DC

## 2021-01-21 NOTE — Progress Notes (Signed)
Interactive audio and video telecommunications were attempted between this provider and patient, however failed, due to patient having technical difficulties OR patient did not have access to video capability.  We continued and completed visit with audio only.   Virtual Visit via Telephone Note  I connected with patient on 01/21/21  at 2:48 PM  by telephone and verified that I am speaking with the correct person using two identifiers.  Location of patient: work  Location of MD: [place] Name of referring provider (if blank then none associated): Names per persons and role in encounter:  MD: Earlyne Iba, Patient: name listed above.    I discussed the limitations, risks, security and privacy concerns of performing an evaluation and management service by telephone and the availability of in person appointments. I also discussed with the patient that there may be a patient responsible charge related to this service. The patient expressed understanding and agreed to proceed.  CC: abd pain.    History of Present Illness:  3 episodes of abd pain.  He usually doesn't have problems but 2 of the 3 episodes led to in person eval.  He had neg GI eval/EGD.  Dr. Havery Moros advised taking PPI in the meantime.  Patient had a friend with dry cough and abd pain who was on lisinopril.  D/w pt- he lost 10 lbs intentionally.    Dry cough episodically noted for the last month.  No FCNAVD.  He doesn't feel unwell o/w.    Abd pain, when present is in the epigastrum, it gradually increases then becomes significant.  It can last >1 hour.  Then it will slowly subside.  Oral lidocaine helped, quickly.    We talked about DDX with pancreatitis, vs GERD vs esophageal spasm.  He notes bloating if drinking even 1/2 a beer.  He can drink noncarbonated alcohol w/o troubles.     Observations/Objective: Nad  Speech normal.  Assessment and Plan: Hypertension.  History of episodic abdominal pain and dry cough.   Either or both could be related to lisinopril use.  Is reasonable to change ACE given the cough.  Change to amlodipine and have him update me about his BP and cough in about 10-14 days, sooner if needed.   If he continues to have cough or abdominal pain off lisinopril then we can work that up later on.  He agrees with plan.   Follow Up Instructions: see above.    I discussed the assessment and treatment plan with the patient. The patient was provided an opportunity to ask questions and all were answered. The patient agreed with the plan and demonstrated an understanding of the instructions.   The patient was advised to call back or seek an in-person evaluation if the symptoms worsen or if the condition fails to improve as anticipated.  I provided 23 minutes of non-face-to-face time during this encounter.  Elsie Stain, MD

## 2021-01-23 NOTE — Assessment & Plan Note (Signed)
Hypertension.  History of episodic abdominal pain and dry cough.  Either or both could be related to lisinopril use.  Is reasonable to change ACE given the cough.  Change to amlodipine and have him update me about his BP and cough in about 10-14 days, sooner if needed.   If he continues to have cough or abdominal pain off lisinopril then we can work that up later on.  He agrees with plan.

## 2021-01-26 ENCOUNTER — Encounter: Payer: Self-pay | Admitting: Family Medicine

## 2021-01-28 ENCOUNTER — Other Ambulatory Visit: Payer: Self-pay | Admitting: Family Medicine

## 2021-01-28 MED ORDER — AMLODIPINE BESYLATE 5 MG PO TABS: 10.0000 mg | ORAL_TABLET | Freq: Every day | ORAL | Status: DC

## 2021-01-31 ENCOUNTER — Other Ambulatory Visit: Payer: Self-pay | Admitting: Family Medicine

## 2021-02-06 ENCOUNTER — Other Ambulatory Visit (HOSPITAL_COMMUNITY): Payer: Self-pay | Admitting: Urology

## 2021-02-06 ENCOUNTER — Ambulatory Visit (HOSPITAL_COMMUNITY)
Admission: RE | Admit: 2021-02-06 | Discharge: 2021-02-06 | Disposition: A | Discharge status: Home / Self Care | Payer: Managed Care, Other (non HMO) | Source: Ambulatory Visit | Attending: Urology | Admitting: Urology

## 2021-02-06 DIAGNOSIS — Z85528 Personal history of other malignant neoplasm of kidney: Secondary | ICD-10-CM

## 2021-02-08 ENCOUNTER — Other Ambulatory Visit: Payer: Self-pay | Admitting: Family Medicine

## 2021-02-08 MED ORDER — HYDROCHLOROTHIAZIDE 12.5 MG PO TABS: 12.5000 mg | ORAL_TABLET | Freq: Every day | ORAL | 3 refills | Status: DC

## 2021-04-20 ENCOUNTER — Other Ambulatory Visit: Payer: Self-pay | Admitting: Family Medicine

## 2021-04-20 DIAGNOSIS — E785 Hyperlipidemia, unspecified: Secondary | ICD-10-CM

## 2021-04-21 NOTE — Telephone Encounter (Signed)
Pt is due for CPE. Please schedule

## 2021-04-23 NOTE — Telephone Encounter (Signed)
Called pt,pt states he will call back to schedule appt.

## 2021-05-08 ENCOUNTER — Other Ambulatory Visit: Payer: Self-pay | Admitting: Family Medicine

## 2021-05-18 ENCOUNTER — Other Ambulatory Visit: Payer: Self-pay | Admitting: Family Medicine

## 2021-05-23 ENCOUNTER — Encounter: Payer: Self-pay | Admitting: Family Medicine

## 2021-06-25 ENCOUNTER — Encounter: Payer: Managed Care, Other (non HMO) | Admitting: Family Medicine

## 2021-07-05 ENCOUNTER — Encounter: Payer: Self-pay | Admitting: Family Medicine

## 2021-07-05 ENCOUNTER — Ambulatory Visit (INDEPENDENT_AMBULATORY_CARE_PROVIDER_SITE_OTHER): Payer: BC Managed Care – PPO | Admitting: Family Medicine

## 2021-07-05 VITALS — BP 140/80 | HR 76 | Temp 97.8°F | Ht 73.0 in | Wt 215.0 lb

## 2021-07-05 DIAGNOSIS — I10 Essential (primary) hypertension: Secondary | ICD-10-CM

## 2021-07-05 DIAGNOSIS — L408 Other psoriasis: Secondary | ICD-10-CM

## 2021-07-05 DIAGNOSIS — M109 Gout, unspecified: Secondary | ICD-10-CM | POA: Diagnosis not present

## 2021-07-05 DIAGNOSIS — Z7189 Other specified counseling: Secondary | ICD-10-CM

## 2021-07-05 DIAGNOSIS — R1013 Epigastric pain: Secondary | ICD-10-CM

## 2021-07-05 DIAGNOSIS — K229 Disease of esophagus, unspecified: Secondary | ICD-10-CM

## 2021-07-05 DIAGNOSIS — E785 Hyperlipidemia, unspecified: Secondary | ICD-10-CM

## 2021-07-05 DIAGNOSIS — Z85528 Personal history of other malignant neoplasm of kidney: Secondary | ICD-10-CM

## 2021-07-05 DIAGNOSIS — Z0000 Encounter for general adult medical examination without abnormal findings: Secondary | ICD-10-CM

## 2021-07-05 LAB — CBC WITH DIFFERENTIAL/PLATELET
Basophils Absolute: 0.1 10*3/uL (ref 0.0–0.1)
Basophils Relative: 0.8 % (ref 0.0–3.0)
Eosinophils Absolute: 0.2 10*3/uL (ref 0.0–0.7)
Eosinophils Relative: 1.7 % (ref 0.0–5.0)
HCT: 48.6 % (ref 39.0–52.0)
Hemoglobin: 16.4 g/dL (ref 13.0–17.0)
Lymphocytes Relative: 26.5 % (ref 12.0–46.0)
Lymphs Abs: 2.8 10*3/uL (ref 0.7–4.0)
MCHC: 33.8 g/dL (ref 30.0–36.0)
MCV: 91.5 fl (ref 78.0–100.0)
Monocytes Absolute: 1.4 10*3/uL — ABNORMAL HIGH (ref 0.1–1.0)
Monocytes Relative: 12.9 % — ABNORMAL HIGH (ref 3.0–12.0)
Neutro Abs: 6.2 10*3/uL (ref 1.4–7.7)
Neutrophils Relative %: 58.1 % (ref 43.0–77.0)
Platelets: 279 10*3/uL (ref 150.0–400.0)
RBC: 5.31 Mil/uL (ref 4.22–5.81)
RDW: 12.5 % (ref 11.5–15.5)
WBC: 10.7 10*3/uL — ABNORMAL HIGH (ref 4.0–10.5)

## 2021-07-05 LAB — COMPREHENSIVE METABOLIC PANEL
ALT: 17 U/L (ref 0–53)
AST: 21 U/L (ref 0–37)
Albumin: 4.4 g/dL (ref 3.5–5.2)
Alkaline Phosphatase: 89 U/L (ref 39–117)
BUN: 18 mg/dL (ref 6–23)
CO2: 30 mEq/L (ref 19–32)
Calcium: 9.7 mg/dL (ref 8.4–10.5)
Chloride: 102 mEq/L (ref 96–112)
Creatinine, Ser: 1.24 mg/dL (ref 0.40–1.50)
GFR: 65.82 mL/min (ref >60.00)
Glucose, Bld: 77 mg/dL (ref 70–99)
Potassium: 4 mEq/L (ref 3.5–5.1)
Sodium: 140 mEq/L (ref 135–145)
Total Bilirubin: 0.5 mg/dL (ref 0.2–1.2)
Total Protein: 7.5 g/dL (ref 6.0–8.3)

## 2021-07-05 LAB — URIC ACID: Uric Acid, Serum: 9.3 mg/dL — ABNORMAL HIGH (ref 4.0–7.8)

## 2021-07-05 LAB — LDL CHOLESTEROL, DIRECT: Direct LDL: 78 mg/dL

## 2021-07-05 LAB — LIPID PANEL
Cholesterol: 176 mg/dL (ref 0–200)
HDL: 34.1 mg/dL — ABNORMAL LOW (ref >39.00)
Total CHOL/HDL Ratio: 5
Triglycerides: 437 mg/dL — ABNORMAL HIGH (ref 0.0–149.0)

## 2021-07-05 MED ORDER — FENOFIBRATE 145 MG PO TABS: ORAL_TABLET | ORAL | 3 refills | Status: DC

## 2021-07-05 MED ORDER — COLCHICINE 0.6 MG PO TABS: 0.6000 mg | ORAL_TABLET | Freq: Every day | ORAL | 1 refills | Status: DC | PRN

## 2021-07-05 MED ORDER — AMLODIPINE BESYLATE 5 MG PO TABS: 5.0000 mg | ORAL_TABLET | Freq: Every day | ORAL | Status: DC

## 2021-07-05 MED ORDER — CLOBETASOL PROPIONATE 0.05 % EX SOLN: CUTANEOUS | 2 refills | Status: DC

## 2021-07-05 MED ORDER — OMEPRAZOLE 40 MG PO CPDR: 40.0000 mg | DELAYED_RELEASE_CAPSULE | Freq: Every day | ORAL | 3 refills | Status: DC

## 2021-07-05 MED ORDER — FLUOCINONIDE 0.05 % EX SOLN: CUTANEOUS | 2 refills | Status: DC

## 2021-07-05 NOTE — Patient Instructions (Addendum)
Go to the lab on the way out.   If you have mychart we'll likely use that to update you.    ?Take care.  Glad to see you. ?Stop HCTZ.  Use colchicine if needed.   ?If your BP is above 140/90, then increase amlodipine to '10mg'$  a day.   ?Update me as needed.  ?Check with your insurance to see if they will cover the shingles shot. ? ?Please ask the front for a record release for your most recent urology visit.   ?

## 2021-07-05 NOTE — Progress Notes (Signed)
CPE- See plan.  Routine anticipatory guidance given to patient.  See health maintenance.  The possibility exists that previously documented standard health maintenance information may have been brought forward from a previous encounter into this note.  If needed, that same information has been updated to reflect the current situation based on today's encounter.   ? ?Tetanus 2017 ?Flu shot prev done.   ?Shingles d/w pt and PNA not due.  ?covid vaccine prev done.   ?Colonoscopy 2019 ?Prostate cancer screening and PSA options (with potential risks and benefits of testing vs not testing) were discussed along with recent recs/guidelines.  He declined testing PSA at this point.  ?Living will.  Wife designated if patient were incapacitated.   ?Diet and exercise d/w pt.   ? ?R1st toe pain, improved with tart cherry juice.  Pinkish changes at R 1st MTP.  Pain with the sheet laying across the foot.  On HCTZ.   ? ?H/o RCC, had seen urology.  No blood in urine.   ? ?Hypertension:    ?Using medication without problems or lightheadedness: yes ?Chest pain with exertion:no ?Edema:no ?Short of breath:no ?He is taking HCTZ 12.'5mg'$  a day and amlodipine '5mg'$  a day.   ? ?Elevated Cholesterol: ?Using medications without problems: yes ?Muscle aches: no ?Diet compliance: yes ?Exercise: yes ? ?Cough and abd pain improved off ACE and on PPI.  D/w pt. would continue PPI at this point and stay off ACE inhibitor's. ? ?Psoriasis patches notes on the skin, R lower leg.   ? ?PMH and SH reviewed ? ?Meds, vitals, and allergies reviewed.  ? ?ROS: Per HPI.  Unless specifically indicated otherwise in HPI, the patient denies: ? ?General: fever. ?Eyes: acute vision changes ?ENT: sore throat ?Cardiovascular: chest pain ?Respiratory: SOB ?GI: vomiting ?GU: dysuria ?Musculoskeletal: acute back pain ?Derm: acute rash ?Neuro: acute motor dysfunction ?Psych: worsening mood ?Endocrine: polydipsia ?Heme: bleeding ?Allergy: hayfever ? ?GEN: nad, alert and  oriented ?HEENT: ncat ?NECK: supple w/o LA ?CV: rrr. ?PULM: ctab, no inc wob ?ABD: soft, +bs ?EXT: no edema ?SKIN: chronic psoriatic changes noted.  Mildly pink R 1st MTP.   ?

## 2021-07-07 DIAGNOSIS — M109 Gout, unspecified: Secondary | ICD-10-CM | POA: Insufficient documentation

## 2021-07-07 NOTE — Assessment & Plan Note (Signed)
Per u rology 

## 2021-07-07 NOTE — Assessment & Plan Note (Signed)
Stop HCTZ.  If BP is above 140/90, then increase amlodipine to '10mg'$  a day.  See notes on labs. ?

## 2021-07-07 NOTE — Assessment & Plan Note (Signed)
Per dermatology. 

## 2021-07-07 NOTE — Assessment & Plan Note (Signed)
Discussed options.  See notes on labs.  Gout pathophysiology discussed with patient.  Discussed hydrochlorothiazide use. ?Stop HCTZ.  Use colchicine if needed.   ?If BP is above 140/90, then increase amlodipine to '10mg'$  a day.   ?Update me as needed.  He agrees with plan. ?

## 2021-07-07 NOTE — Assessment & Plan Note (Signed)
Living will.  Wife designated if patient were incapacitated.   ?

## 2021-07-07 NOTE — Assessment & Plan Note (Signed)
Tetanus 2017 ?Flu shot prev done.   ?Shingles d/w pt and PNA not due.  ?covid vaccine prev done.   ?Colonoscopy 2019 ?Prostate cancer screening and PSA options (with potential risks and benefits of testing vs not testing) were discussed along with recent recs/guidelines.  He declined testing PSA at this point.  ?Living will.  Wife designated if patient were incapacitated.   ?Diet and exercise d/w pt.   ?

## 2021-07-07 NOTE — Assessment & Plan Note (Signed)
Continue niacin and fenofibrate.  Continue work on diet and exercise.  See notes on labs. ?

## 2021-07-25 ENCOUNTER — Other Ambulatory Visit: Payer: Self-pay | Admitting: Family Medicine

## 2021-11-05 ENCOUNTER — Encounter: Payer: Self-pay | Admitting: Family Medicine

## 2021-11-05 ENCOUNTER — Ambulatory Visit (INDEPENDENT_AMBULATORY_CARE_PROVIDER_SITE_OTHER): Payer: BC Managed Care – PPO | Admitting: Family Medicine

## 2021-11-05 VITALS — BP 120/80 | HR 58 | Temp 98.1°F | Ht 73.0 in | Wt 217.0 lb

## 2021-11-05 DIAGNOSIS — I10 Essential (primary) hypertension: Secondary | ICD-10-CM | POA: Diagnosis not present

## 2021-11-05 DIAGNOSIS — L989 Disorder of the skin and subcutaneous tissue, unspecified: Secondary | ICD-10-CM | POA: Diagnosis not present

## 2021-11-05 MED ORDER — LISINOPRIL 20 MG PO TABS: 20.0000 mg | ORAL_TABLET | Freq: Every day | ORAL | 3 refills | Status: DC

## 2021-11-05 NOTE — Patient Instructions (Signed)
We'll call about getting you in at dermatology.  If you can't get in soon, then let me know.  Possible SCC vs BCC.

## 2021-11-05 NOTE — Progress Notes (Unsigned)
He has been able to tolerate lisinopril.  Off amlodipine.  He doesn't have nocturia on lisinopril and off amlodipine.   He clearly has less esophageal sx on PPI.  He is better in the meantime.   Crusty skin lesion for 1 month.

## 2021-11-06 DIAGNOSIS — L989 Disorder of the skin and subcutaneous tissue, unspecified: Secondary | ICD-10-CM | POA: Insufficient documentation

## 2021-11-06 NOTE — Assessment & Plan Note (Signed)
Would continue lisinopril as is.  He will update me as needed.

## 2021-11-06 NOTE — Assessment & Plan Note (Signed)
Differential discussed with patient.  This could be squamous cell versus a basal cell.  It is small enough where it could either be excised or it could be destroyed with liquid nitrogen.  He has other dermatologic concerns with psoriasis and I think it makes sense to get set up with dermatology so we referred him.  If he is not going to be able to get seen sooner he can let me know.  He is aware that it could be a skin cancer but it does not look like an urgent/ominous issue such as melanoma.

## 2021-11-29 DIAGNOSIS — L814 Other melanin hyperpigmentation: Secondary | ICD-10-CM | POA: Diagnosis not present

## 2021-11-29 DIAGNOSIS — L404 Guttate psoriasis: Secondary | ICD-10-CM | POA: Diagnosis not present

## 2021-11-29 DIAGNOSIS — B078 Other viral warts: Secondary | ICD-10-CM | POA: Diagnosis not present

## 2021-11-29 DIAGNOSIS — L570 Actinic keratosis: Secondary | ICD-10-CM | POA: Diagnosis not present

## 2021-11-29 DIAGNOSIS — D485 Neoplasm of uncertain behavior of skin: Secondary | ICD-10-CM | POA: Diagnosis not present

## 2021-12-21 ENCOUNTER — Other Ambulatory Visit: Payer: Self-pay

## 2021-12-21 ENCOUNTER — Emergency Department (HOSPITAL_BASED_OUTPATIENT_CLINIC_OR_DEPARTMENT_OTHER)
Admission: EM | Admit: 2021-12-21 | Discharge: 2021-12-21 | Discharge status: Against Medical Advice | Payer: BC Managed Care – PPO | Attending: Emergency Medicine | Admitting: Emergency Medicine

## 2021-12-21 DIAGNOSIS — R509 Fever, unspecified: Secondary | ICD-10-CM | POA: Diagnosis not present

## 2021-12-21 DIAGNOSIS — Z1152 Encounter for screening for COVID-19: Secondary | ICD-10-CM | POA: Insufficient documentation

## 2021-12-21 DIAGNOSIS — R197 Diarrhea, unspecified: Secondary | ICD-10-CM | POA: Diagnosis not present

## 2021-12-21 DIAGNOSIS — R109 Unspecified abdominal pain: Secondary | ICD-10-CM | POA: Diagnosis not present

## 2021-12-21 DIAGNOSIS — M791 Myalgia, unspecified site: Secondary | ICD-10-CM | POA: Insufficient documentation

## 2021-12-21 DIAGNOSIS — Z5321 Procedure and treatment not carried out due to patient leaving prior to being seen by health care provider: Secondary | ICD-10-CM | POA: Insufficient documentation

## 2021-12-21 LAB — RESP PANEL BY RT-PCR (FLU A&B, COVID) ARPGX2
Influenza A by PCR: NEGATIVE
Influenza B by PCR: NEGATIVE
SARS Coronavirus 2 by RT PCR: NEGATIVE

## 2021-12-21 LAB — URINALYSIS, ROUTINE W REFLEX MICROSCOPIC
Bilirubin Urine: NEGATIVE
Glucose, UA: NEGATIVE mg/dL
Ketones, ur: NEGATIVE mg/dL
Leukocytes,Ua: NEGATIVE
Nitrite: NEGATIVE
Specific Gravity, Urine: 1.029 (ref 1.005–1.030)
pH: 5.5 (ref 5.0–8.0)

## 2021-12-21 LAB — COMPREHENSIVE METABOLIC PANEL
ALT: 25 U/L (ref 0–44)
AST: 37 U/L (ref 15–41)
Albumin: 4.2 g/dL (ref 3.5–5.0)
Alkaline Phosphatase: 79 U/L (ref 38–126)
Anion gap: 12 (ref 5–15)
BUN: 21 mg/dL — ABNORMAL HIGH (ref 6–20)
CO2: 24 mmol/L (ref 22–32)
Calcium: 9.3 mg/dL (ref 8.9–10.3)
Chloride: 103 mmol/L (ref 98–111)
Creatinine, Ser: 1.42 mg/dL — ABNORMAL HIGH (ref 0.61–1.24)
GFR, Estimated: 58 mL/min — ABNORMAL LOW (ref >60)
Glucose, Bld: 109 mg/dL — ABNORMAL HIGH (ref 70–99)
Potassium: 4.2 mmol/L (ref 3.5–5.1)
Sodium: 139 mmol/L (ref 135–145)
Total Bilirubin: 0.6 mg/dL (ref 0.3–1.2)
Total Protein: 7.3 g/dL (ref 6.5–8.1)

## 2021-12-21 LAB — CBC
HCT: 46.3 % (ref 39.0–52.0)
Hemoglobin: 15.9 g/dL (ref 13.0–17.0)
MCH: 31.3 pg (ref 26.0–34.0)
MCHC: 34.3 g/dL (ref 30.0–36.0)
MCV: 91.1 fL (ref 80.0–100.0)
Platelets: 232 10*3/uL (ref 150–400)
RBC: 5.08 MIL/uL (ref 4.22–5.81)
RDW: 12.3 % (ref 11.5–15.5)
WBC: 11.2 10*3/uL — ABNORMAL HIGH (ref 4.0–10.5)
nRBC: 0 % (ref 0.0–0.2)

## 2021-12-21 LAB — LIPASE, BLOOD: Lipase: 21 U/L (ref 11–51)

## 2021-12-21 NOTE — ED Triage Notes (Signed)
Patient arrives with complaints of fever (high of 100F), chills, and abdominal pain x3 days. Patient also report some diarrhea, no vomiting. Some bodyaches

## 2021-12-21 NOTE — ED Notes (Signed)
Per registration, pt has left the department. LWBS after triage

## 2021-12-22 ENCOUNTER — Telehealth: Payer: Self-pay | Admitting: Family Medicine

## 2021-12-22 NOTE — Telephone Encounter (Signed)
Please get update on patient.  Thanks. 

## 2021-12-23 ENCOUNTER — Telehealth: Payer: Self-pay | Admitting: Family Medicine

## 2021-12-23 DIAGNOSIS — R109 Unspecified abdominal pain: Secondary | ICD-10-CM

## 2021-12-23 NOTE — Telephone Encounter (Signed)
Spoke with patient and he is fine with both referral. Advised patient that he will get a call to schedule appts from both places. Advised if he worsens then he needs ER eval; and if he still having watery diarrhea then needs f/u here.

## 2021-12-23 NOTE — Telephone Encounter (Signed)
I spoke with pts wife and then called pt who is at work; pt said for 5 days on and off pt has had upper mid sharp to dull abd pain; each episode pt has had has lasted few hours each time. When has abd pain also has bilateral lower back pain. Pt said heating pad has helped the back pain. Pt has greatly reduced appetite and pt is not eating a lot; pt said has lost 8 lbs in last 5 days. Pts wife said due to not eating pt has great amt of decrease in energy level and strength. Pt has had fever on 12/21/21 temp was 101; on 12/22/21 temp 100; pt has been taking tylenol once or twice a day. Pt has not cked temp today because was necessary for pt to go to work today. Pt has not had N&V but had watery diarrhea for 5 days and no diarrhea since 12/22/21 in afternoon. Pt does have dry mouth, abd pain and one day did have lightheadedness. Pt does not have UTI symptoms with no burning or pain upon urination; pt is trying to drink and has been voiding normal amt. No difference in usual color of urine and no blood seen in urine. 5 yrs ago pt had kidney CA and had surgery to remove part of kidney; pts wife said surgeon had hoped to get more of the margins at time of surgery so pts wife concerned about that also. Pt waited for 6 hrs at ED on 12/21/21 and then left without being seen. Pt said he does not want to go back to ED but if Dr Damita Dunnings thinks he should go to ED pt said he will go; pt said "he feels like he has a bomb in his pocket that is ready to go off" pt s wife said any help or guidance appreciated because GI and urology have not been helpful. Pt request cb after reviewed by Dr Damita Dunnings. Sending note to Dr Joanne Gavel CMA and will teams [facility].

## 2021-12-23 NOTE — Telephone Encounter (Signed)
See other phone note

## 2021-12-23 NOTE — Telephone Encounter (Signed)
Patient wife Deneise Lever called in and stated that Liliane Channel went to the hospital on Saturday and waited 6hrs but was never seen. He did have bloodwork done but was never ever to discuss because they left. She was wanting to know if Dr. Damita Dunnings could look over his labs and help them get appointments with Santina Evans and Urologist. She stated that he has the abdominal pain for awhile and they are concerned. She can be reached at [phone]. Please advise. Thank you!

## 2021-12-23 NOTE — Telephone Encounter (Addendum)
This could have been a self resolving GI illness.   Rest and fluids in the meantime.  I'll put in the referrals to GI and urology if patient is okay with that.   In the meantime, if still having watery diarrhea then needs eval here in the office.  If severe sx, then ER in the meantime.

## 2021-12-23 NOTE — Telephone Encounter (Signed)
Please triage patient about symptoms.  Let me know about that.   Mild inc in creatinine.  Could be from mild/relative dehydration.   Protein and hgb on urine dip but micro was neg for blood.    Minimal inc in WBC, nonspecific finding.    Let me know about his symptoms and we'll go from there.  Thanks.

## 2021-12-23 NOTE — Telephone Encounter (Signed)
[facility] called in with Parker Ryan on the line. They stated she had hung up and tried to call her back to speak with [name] but no answer.

## 2021-12-23 NOTE — Addendum Note (Signed)
Addended by: Tonia Ghent on: 12/23/2021 05:05 PM   Modules accepted: Orders

## 2021-12-24 NOTE — Telephone Encounter (Signed)
Referrals signed.  Thanks.

## 2021-12-24 NOTE — Addendum Note (Signed)
Addended by: Tonia Ghent on: 12/24/2021 12:16 PM   Modules accepted: Orders

## 2022-01-27 NOTE — Progress Notes (Signed)
Assessment    Patient profile:  Parker Ryan is a 56 y.o. male known to Dr. Havery Moros with a past medical history of H.pylori infection. See PMH /PSH for additional history  # 56 yo male with chronic, intermittent abdominal pain. He has intermittent sharp epigastric pain but at other times more generalized abdominal pain with associated gas, bloating and intermittent loose stool. RUQ, CT angio, EGD, colonoscopy all unrevealing. Celiac serologies negative. Epigastric pain refractory of trial of PPI. Abdominal pain is sporadic and he feels fine in between times. The generalized abdominal pain with associated bloating / gas possibly function in nature and / or secondary to a food intolerance. Given sporadic nature of the pain SIBO seems unlikely. Query is the severe epigastric pain is spasm related.   # History of H.pylori, s/p treatment with confirmation of eradication  Plan  I do not think repeat imaging or upper endoscopy. will be high yield Query trial of a TCA? Patient is more well known to Dr. Havery Moros so I would like to get his thoughts before committing patient to daily medication.   HPI    Chief complaint: abdominal pain     Parker Ryan is followed by Dr. Havery Moros for abdominal pain.   office visit He was last seen 11/28/20, please refer to that office note for details. In summary, he wasn't having any epigastric pain but was having a new intermittent pain at bottom of right rib cage lateral to his umbilicus. He was bloated and gassy. Pain possibly musculoskeletal but not reproducible on exam. Basic labs obtained and normal. CT scan in February didn't really show any cause. He wanted to hold off on repeat CT scan. Labs obtained for celiac and were normal.   Telephone call 10/28/ 22. Had to go to urgent care for more episodes of epigastric pain. He wanted to go ahead with EGD. Nothing on EGD to exam the pain ( see full report below). Our thought was that he could be having having  transient reflux episodes causing pain versus esophageal spasm. We recommend omeprazole 40 mg daily for a few months. Also prescribed viscous lidocaine.   Interval History:   Parker Ryan returns with abdominal pain. He has two types of pain.   Epigastric pain: Still has episodes of very severe, stabbing epigastric pain that can radiate through to his back and lasts several hours. He isn't really sure that it is food related. He gets these episodes about once every six months. Most recent episode was on Thanksgiving   Generalized abdominal pain:  He also gets intermittent, dull, generalized abdominal pain. Episodes occur about once a month  He can relate the pain to certain foods and has noticed some correlation of symptoms with gluten and certain cheeses though nothing on a consistent basis. The pain can linger for days. During these episodes he tends to belch a lot. He feels gassy and can have some loose stools. The viscous lidocaine does provide transient relief of symptoms.    His weight is stable.    In between the episodes of both types of abdominal pain he feels completely normal   He is still taking daily Omeprazole but can't really say that that it has helped decrease the frequency or intensity of either abdominal pain.    Labs Oct 2023  Cr 1.42, lipase 21, LFTs normal.  WBC 11.2 Hgb 15.9   Previous GI Evaluation    [place] in Feb 2022 for epigastric pain was unrevealing except for fatty liver.  February 2022 CTA chest / abd for abdominal pain negative for GI abnormalities.  Nov 2022 EGD for abdominal pain  -Esophagogastric landmarks identified. - 1 cm hiatal hernia. 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 Removed with forceps. - Normal stomach. Biopsied to check for H pylori eradication in light of recurrent symptoms. - Normal duodenal bulb and second portion of the duodenum.  Surgical [P], gastric GASTRIC MUCOSA WITH MINIMAL VASCULAR ECTASIA CONSISTENT WITH  REACTIVE GASTROPATHY. H. PYLORI AND INTESTINAL METAPLASIA ARE NOT IDENTIFIED. 2. Surgical [P], esophagus FRAGMENTS OF NORMAL ESOPHAGEAL MUCOSA. THERE ARE NO DIAGNOSTIC FEATURES OF EOSINOPHILIC ESOPHAGITIS   Labs:     Latest Ref Rng & Units 12/21/2021    5:47 PM 07/05/2021    8:48 AM 11/28/2020    2:27 PM  CBC  WBC 4.0 - 10.5 K/uL 11.2  10.7  9.3   Hemoglobin 13.0 - 17.0 g/dL 15.9  16.4  15.9   Hematocrit 39.0 - 52.0 % 46.3  48.6  46.3   Platelets 150 - 400 K/uL 232  279.0  258.0        Latest Ref Rng & Units 12/21/2021    5:47 PM 07/05/2021    8:48 AM 11/28/2020    2:27 PM  Hepatic Function  Total Protein 6.5 - 8.1 g/dL 7.3  7.5  7.0   Albumin 3.5 - 5.0 g/dL 4.2  4.4  4.1   AST 15 - 41 U/L 37  21  17   ALT 0 - 44 U/L _0 Alk Phosphatase 38 - 126 U/L 79  89  80   Total Bilirubin 0.3 - 1.2 mg/dL 0.6  0.5  0.5      Past Medical History:  Diagnosis Date   Arthritis    right big toe   Cancer ([place])    Gout    H. pylori infection    H/O renal cell carcinoma    Hyperlipemia 09/2005   Hypertension 10/2004   Migraine    Psoriasis    Renal stones    Sore throat    no fever- began 10/12/12    Past Surgical History:  Procedure Laterality Date   COLONOSCOPY     MRA Head and Neck  05/18/09   Nml   MRI Head  05/18/09   Multip nonspecific changes   ROBOTIC ASSITED PARTIAL NEPHRECTOMY Right 10/18/2012   Procedure: ROBOTIC ASSITED PARTIAL NEPHRECTOMY;  Surgeon: Dutch Gray, MD;  Location: [facility];  Service: Urology;  Laterality: Right;    Current Medications, Allergies, Family History and Social History were reviewed in [employer] record.     Current Outpatient Medications  Medication Sig Dispense Refill   Alum Hydroxide-Mag Trisilicate (GAVISCON) 95-18.8 MG CHEW Take as directed as needed 224 tablet    clobetasol (TEMOVATE) 0.05 % external solution Apply 1-2 times daily as needed. 50 mL 2   colchicine 0.6 MG tablet Take 1 tablet (0.6 mg  total) by mouth daily as needed. 30 tablet 1   famotidine (PEPCID) 20 MG tablet Take 1 tablet (20 mg total) by mouth daily as needed for heartburn or indigestion.     fenofibrate (TRICOR) 145 MG tablet TAKE 1 TABLET(145 MG) BY MOUTH DAILY 90 tablet 3   fluocinonide (LIDEX) 0.05 % external solution APPLY TOPICALLY TO THE AFFECTED AREA EVERY DAY AS DIRECTED 60 mL 2   lidocaine (XYLOCAINE) 2 % solution TAKE BY MOUTH 1-2 TIMES DAILY BY MOUTH,DO NOT TAKE WITH ANYTHING  HOT AND DO NOT TAKE WITHIN TWO HOURS OF EATING. (Patient not taking: Reported on 11/05/2021) 100 mL 0   lisinopril (ZESTRIL) 20 MG tablet Take 1 tablet (20 mg total) by mouth daily. 90 tablet 3   Multiple Vitamin (MULTIVITAMIN) tablet Take 1 tablet by mouth daily.     niacin (NIASPAN) 1000 MG CR tablet TAKE 1 TABLET BY MOUTH AT BEDTIME 90 tablet 3   omeprazole (PRILOSEC) 40 MG capsule Take 1 capsule (40 mg total) by mouth daily. 90 capsule 3   No current facility-administered medications for this visit.    Review of Systems: No chest pain. No shortness of breath. No urinary complaints.    Physical Exam  Wt Readings from Last 3 Encounters:  12/21/21 216 lb 14.9 oz (98.4 kg)  11/05/21 217 lb (98.4 kg)  07/05/21 215 lb (97.5 kg)    BP 128/82   Pulse 72   Ht _0  (1.88 m)   Wt 208 lb (94.3 kg)   BMI 26.71 kg/m  Constitutional:  Generally well appearing male in no acute distress. Psychiatric: Pleasant. Normal mood and affect. Behavior is normal. EENT: Pupils normal.  Conjunctivae are normal. No scleral icterus. Neck supple.  Cardiovascular: Normal rate, regular rhythm.  Pulmonary/chest: Effort normal and breath sounds normal. No wheezing, rales or rhonchi. Abdominal: Soft, nondistended, nontender. Bowel sounds active throughout. There are no masses palpable. No hepatomegaly. Neurological: Alert and oriented to person place and time. Skin: Skin is warm and dry. No rashes noted.  I spent 30 minutes total reviewing records,  obtaining history, performing exam, counseling patient and documenting visit / findings.    Tye Savoy, NP  01/27/2022, 4:35 PM

## 2022-01-28 ENCOUNTER — Ambulatory Visit (INDEPENDENT_AMBULATORY_CARE_PROVIDER_SITE_OTHER): Payer: BC Managed Care – PPO | Admitting: Nurse Practitioner

## 2022-01-28 ENCOUNTER — Encounter: Payer: Self-pay | Admitting: Nurse Practitioner

## 2022-01-28 VITALS — BP 128/82 | HR 72 | Ht 74.0 in | Wt 208.0 lb

## 2022-01-28 DIAGNOSIS — R1084 Generalized abdominal pain: Secondary | ICD-10-CM

## 2022-01-28 DIAGNOSIS — G8929 Other chronic pain: Secondary | ICD-10-CM

## 2022-01-28 DIAGNOSIS — R1013 Epigastric pain: Secondary | ICD-10-CM | POA: Diagnosis not present

## 2022-01-28 NOTE — Patient Instructions (Signed)
We will be in contact with you , after speaking Dr. Havery Moros.   _______________________________________________________  If you are age 55 or older, your body mass index should be between 23-30. Your Body mass index is 26.71 kg/m. If this is out of the aforementioned range listed, please consider follow up with your Primary Care Provider.  If you are age 68 or younger, your body mass index should be between 19-25. Your Body mass index is 26.71 kg/m. If this is out of the aformentioned range listed, please consider follow up with your Primary Care Provider.   ________________________________________________________  The [facility] providers would like to encourage you to use [facility] to communicate with providers for non-urgent requests or questions.  Due to long hold times on the telephone, sending your provider a message by [facility] may be a faster and more efficient way to get a response.  Please allow 48 business hours for a response.  Please remember that this is for non-urgent requests.  _______________________________________________________  Thank you for choosing me and [facility].  Tye Savoy NP

## 2022-02-05 ENCOUNTER — Encounter: Payer: Self-pay | Admitting: Nurse Practitioner

## 2022-02-05 DIAGNOSIS — N200 Calculus of kidney: Secondary | ICD-10-CM | POA: Diagnosis not present

## 2022-02-05 NOTE — Progress Notes (Signed)
Agree with assessment as outlined. May be reasonable to do a HIDA scan in regards to intermittent epigastric pain symptoms, ensure his GB is okay. Otherwise, not sure if he has tried bentyl or IB gard as needed for some of the other pains. If he has not would try those. If he has, could consider a trial of Cymbalta or TCA for possible functional pain, if no med interactions. If things progress or have changed, more severe, can otherwise consider repeat cross sectional imaging at some point

## 2022-02-07 ENCOUNTER — Other Ambulatory Visit: Payer: Self-pay

## 2022-02-07 ENCOUNTER — Other Ambulatory Visit: Payer: Self-pay | Admitting: Nurse Practitioner

## 2022-02-07 ENCOUNTER — Telehealth: Payer: Self-pay

## 2022-02-07 DIAGNOSIS — R1013 Epigastric pain: Secondary | ICD-10-CM

## 2022-02-07 MED ORDER — DICYCLOMINE HCL 10 MG PO CAPS: 10.0000 mg | ORAL_CAPSULE | Freq: Two times a day (BID) | ORAL | 1 refills | Status: DC | PRN

## 2022-02-07 NOTE — Telephone Encounter (Signed)
Left message for pt to call back. Dicyclomine sent to pt's pharmacy and order placed for HIDA scan. Message sent to radiology scheduling.

## 2022-02-07 NOTE — Telephone Encounter (Signed)
-----   Message from Willia Craze, NP sent at 02/07/2022  8:14 AM EST ----- Good morning Mickel Baas, would you please call patient and tell him that I talk with Dr. Havery Moros.  He thinks it is reasonable to get a HIDA scan to make sure his gallbladder is functioning.  Would you please arrange for this to be done?  Also, it appears he has not ever tried dicyclomine.  Lets try dicyclomine 10 mg twice daily as needed.  Thanks

## 2022-02-10 NOTE — Telephone Encounter (Signed)
Inbound call from patient requesting a call back to discuss HIDA scan. Please advise.

## 2022-02-10 NOTE — Telephone Encounter (Addendum)
Spoke with pt and gave him Paula's recommendations. Pt wanted to know if the dicyclomine was as needed and how many times per day he should take the medication. Let pt know that medication is to be taken if he has abd pain and up to two times per day. Pt verbalized understanding and had no other concerns at end of call.

## 2022-02-12 DIAGNOSIS — D49511 Neoplasm of unspecified behavior of right kidney: Secondary | ICD-10-CM | POA: Diagnosis not present

## 2022-02-12 DIAGNOSIS — N200 Calculus of kidney: Secondary | ICD-10-CM | POA: Diagnosis not present

## 2022-02-12 DIAGNOSIS — Z85528 Personal history of other malignant neoplasm of kidney: Secondary | ICD-10-CM | POA: Diagnosis not present

## 2022-02-21 ENCOUNTER — Encounter (HOSPITAL_COMMUNITY): Payer: BC Managed Care – PPO

## 2022-02-27 ENCOUNTER — Other Ambulatory Visit: Payer: Self-pay | Admitting: Nurse Practitioner

## 2022-02-27 ENCOUNTER — Encounter (HOSPITAL_COMMUNITY)
Admission: RE | Admit: 2022-02-27 | Discharge: 2022-02-27 | Disposition: A | Discharge status: Home / Self Care | Payer: BC Managed Care – PPO | Source: Ambulatory Visit | Attending: Nurse Practitioner | Admitting: Nurse Practitioner

## 2022-02-27 DIAGNOSIS — R1013 Epigastric pain: Secondary | ICD-10-CM

## 2022-02-27 MED ORDER — TECHNETIUM TC 99M MEBROFENIN IV KIT
5.5000 | PACK | Freq: Once | INTRAVENOUS | Status: AC
  Administered 2022-02-27: 5.5 via INTRAVENOUS

## 2022-02-27 MED ORDER — MORPHINE SULFATE (PF) 4 MG/ML IV SOLN
INTRAVENOUS | Status: AC
  Filled 2022-02-27: qty 1

## 2022-02-27 MED ORDER — MORPHINE SULFATE (PF) 2 MG/ML IV SOLN
3.0000 mg | Freq: Once | INTRAVENOUS | Status: AC
  Administered 2022-02-27: 3 mg via INTRAVENOUS

## 2022-03-06 ENCOUNTER — Other Ambulatory Visit: Payer: Self-pay

## 2022-03-06 DIAGNOSIS — R1013 Epigastric pain: Secondary | ICD-10-CM

## 2022-03-06 DIAGNOSIS — R1084 Generalized abdominal pain: Secondary | ICD-10-CM

## 2022-03-10 ENCOUNTER — Other Ambulatory Visit (INDEPENDENT_AMBULATORY_CARE_PROVIDER_SITE_OTHER): Payer: BC Managed Care – PPO

## 2022-03-10 DIAGNOSIS — R1013 Epigastric pain: Secondary | ICD-10-CM

## 2022-03-10 DIAGNOSIS — R1084 Generalized abdominal pain: Secondary | ICD-10-CM

## 2022-03-10 LAB — CBC WITH DIFFERENTIAL/PLATELET
Basophils Absolute: 0.1 10*3/uL (ref 0.0–0.1)
Basophils Relative: 1.1 % (ref 0.0–3.0)
Eosinophils Absolute: 0.1 10*3/uL (ref 0.0–0.7)
Eosinophils Relative: 1.4 % (ref 0.0–5.0)
HCT: 46.9 % (ref 39.0–52.0)
Hemoglobin: 16.2 g/dL (ref 13.0–17.0)
Lymphocytes Relative: 24.7 % (ref 12.0–46.0)
Lymphs Abs: 2.6 10*3/uL (ref 0.7–4.0)
MCHC: 34.5 g/dL (ref 30.0–36.0)
MCV: 89.4 fl (ref 78.0–100.0)
Monocytes Absolute: 1.3 10*3/uL — ABNORMAL HIGH (ref 0.1–1.0)
Monocytes Relative: 12.4 % — ABNORMAL HIGH (ref 3.0–12.0)
Neutro Abs: 6.4 10*3/uL (ref 1.4–7.7)
Neutrophils Relative %: 60.4 % (ref 43.0–77.0)
Platelets: 296 10*3/uL (ref 150.0–400.0)
RBC: 5.25 Mil/uL (ref 4.22–5.81)
RDW: 13.5 % (ref 11.5–15.5)
WBC: 10.6 10*3/uL — ABNORMAL HIGH (ref 4.0–10.5)

## 2022-03-10 LAB — HEPATIC FUNCTION PANEL
ALT: 14 U/L (ref 0–53)
AST: 18 U/L (ref 0–37)
Albumin: 4.3 g/dL (ref 3.5–5.2)
Alkaline Phosphatase: 69 U/L (ref 39–117)
Bilirubin, Direct: 0 mg/dL (ref 0.0–0.3)
Total Bilirubin: 0.4 mg/dL (ref 0.2–1.2)
Total Protein: 7.1 g/dL (ref 6.0–8.3)

## 2022-03-26 ENCOUNTER — Ambulatory Visit: Payer: Self-pay | Admitting: Surgery

## 2022-03-26 DIAGNOSIS — K432 Incisional hernia without obstruction or gangrene: Secondary | ICD-10-CM | POA: Diagnosis not present

## 2022-03-26 DIAGNOSIS — K811 Chronic cholecystitis: Secondary | ICD-10-CM | POA: Diagnosis not present

## 2022-03-26 NOTE — H&P (View-Only) (Signed)
Parker Ryan [id number]   Referring Provider:  Willia Craze, NP   Subjective   Chief Complaint: New Consultation (Cystic duct obstruction and cholecystitis)     History of Present Illness:    Very pleasant 57 year old male with history of arthritis/gout, H. Pylori (treated), hypertension, hyperlipidemia, migraines, kidney stones, kidney cancer status post robotic assisted partial right nephrectomy in 2014, psoriasis who is referred for evaluation of chronic cholecystitis.  Over the course of a year and a half, he has had about 5 episodes of severe very localized epigastric pain which radiates to his back.  These last for 5 to 6 hours and on more than one occasion have brought him to the emergency department.  He reports some nausea, which he thinks is more related to the pain.  He does endorse intermittent indigestion and irregular bowel movements/bloating as well. He has been worked up by his gastroenterologist ([place] [facility] and Dr. Havery Moros) and was last seen on 12/5 He has had a negative right upper quadrant ultrasound, CT angio, upper and lower endoscopy (all of this was in 2022) and no improvement with PPI.  I have reviewed the imaging personally and I do think he had may have had a radiopaque gallstone on his CTA.  He underwent HIDA earlier this month that shows nonvisualization of the gallbladder.  His recent lab work was the 15th of this month and included normal LFTs and normal CBC. He is currently asymptomatic.  He is a Land, currently more of a Freight forwarder position.  No tobacco/alcohol/drug use.   Review of Systems: A complete review of systems was obtained from the patient.  I have reviewed this information and discussed as appropriate with the patient.  See HPI as well for other ROS.   Medical History: Past Medical History:  Diagnosis Date   Arthritis    GERD (gastroesophageal reflux disease)    History of cancer    Hyperlipidemia     Hypertension     There is no problem list on file for this patient.   Past Surgical History:  Procedure Laterality Date   Robotic assited partial nephrectomy (Right  10/18/2012     No Known Allergies  Current Outpatient Medications on File Prior to Visit  Medication Sig Dispense Refill   fenofibrate nanocrystallized (TRICOR) 145 MG tablet TAKE 1 TABLET(145 MG) BY MOUTH DAILY     lisinopriL (ZESTRIL) 20 MG tablet Take 20 mg by mouth once daily     niacin (NIASPAN) 1000 MG ER tablet Take 1 tablet by mouth at bedtime     omeprazole (PRILOSEC) 40 MG DR capsule Take by mouth     No current facility-administered medications on file prior to visit.    Family History  Problem Relation Age of Onset   Hyperlipidemia (Elevated cholesterol) Mother    High blood pressure (Hypertension) Father    Coronary Artery Disease (Blocked arteries around heart) Father      Social History   Tobacco Use  Smoking Status Never  Smokeless Tobacco Never     Social History   Socioeconomic History   Marital status: Married  Tobacco Use   Smoking status: Never   Smokeless tobacco: Never  Vaping Use   Vaping Use: Never used  Substance and Sexual Activity   Alcohol use: Yes    Alcohol/week: 2.0 standard drinks of alcohol    Types: 2 Standard drinks or equivalent per week   Drug use: Never    Objective:    Vitals:  03/26/22 1022 03/26/22 1103  BP: (!) 156/94 128/84  Pulse: 68   Temp: 36.8 C (98.3 F)   SpO2: 99%   Weight: 97.2 kg (214 lb 3.2 oz)   Height: 188 cm (6' 2"$ )   PainSc: 0-No pain     Body mass index is 27.5 kg/m.  [name], well-appearing Unlabored respirations Abdomen is soft, nontender, nondistended.  Reducible umbilical hernia.  Incisions from his robotic partial nephrectomy are not really visible.  Assessment and Plan:  Diagnoses and all orders for this visit:  Cholecystitis, chronic  Incisional hernia, without obstruction or gangrene    We had a long  discussion today going over his workup in the past as well as his symptoms.  His pain has been very focal, in the epigastric region and he does have some other symptoms that do go along with at least biliary dyskinesia or biliary colic.  He has had a fairly extensive workup with the only abnormal finding officially reported being the positive HIDA scan, though on my review of his CTA from last year I think I can see some small gallstones.  We discussed also other possible etiologies of his pain including esophageal spasm, but given the whole picture I think it is reasonable to proceed with laparoscopic cholecystectomy.  I discussed with him the relevant anatomy of the biliary tract, using a diagram to demonstrate, and we went over the technique of laparoscopic cholecystectomy. Discussed risks of surgery including bleeding, pain, scarring, intraabdominal injury specifically to the common bile duct and sequelae, bile leak, conversion to open surgery, failure to resolve symptoms, blood clots/ pulmonary embolus, heart attack, pneumonia, stroke, etc. Questions welcomed and answered to patient's satisfaction.  He wishes to proceed with surgery.  Milon Dethloff Raquel James, MD

## 2022-03-26 NOTE — H&P (Signed)
Parker Ryan [id number]   Referring Provider:  Guenther, Paula M, NP   Subjective   Chief Complaint: New Consultation (Cystic duct obstruction and cholecystitis)     History of Present Illness:    Very pleasant 56-year-old male with history of arthritis/gout, H. Pylori (treated), hypertension, hyperlipidemia, migraines, kidney stones, kidney cancer status post robotic assisted partial right nephrectomy in 2014, psoriasis who is referred for evaluation of chronic cholecystitis.  Over the course of a year and a half, he has had about 5 episodes of severe very localized epigastric pain which radiates to his back.  These last for 5 to 6 hours and on more than one occasion have brought him to the emergency department.  He reports some nausea, which he thinks is more related to the pain.  He does endorse intermittent indigestion and irregular bowel movements/bloating as well. He has been worked up by his gastroenterologist (Parker Ryan and Parker Ryan) and was last seen on 12/5 He has had a negative right upper quadrant ultrasound, CT angio, upper and lower endoscopy (all of this was in 2022) and no improvement with PPI.  I have reviewed the imaging personally and I do think he had may have had a radiopaque gallstone on his CTA.  He underwent HIDA earlier this month that shows nonvisualization of the gallbladder.  His recent lab work was the 15th of this month and included normal LFTs and normal CBC. He is currently asymptomatic.  He is a mechanical engineer, currently more of a manager position.  No tobacco/alcohol/drug use.   Review of Systems: A complete review of systems was obtained from the patient.  I have reviewed this information and discussed as appropriate with the patient.  See HPI as well for other ROS.   Medical History: Past Medical History:  Diagnosis Date   Arthritis    GERD (gastroesophageal reflux disease)    History of cancer    Hyperlipidemia     Hypertension     There is no problem list on file for this patient.   Past Surgical History:  Procedure Laterality Date   Robotic assited partial nephrectomy (Right  10/18/2012     No Known Allergies  Current Outpatient Medications on File Prior to Visit  Medication Sig Dispense Refill   fenofibrate nanocrystallized (TRICOR) 145 MG tablet TAKE 1 TABLET(145 MG) BY MOUTH DAILY     lisinopriL (ZESTRIL) 20 MG tablet Take 20 mg by mouth once daily     niacin (NIASPAN) 1000 MG ER tablet Take 1 tablet by mouth at bedtime     omeprazole (PRILOSEC) 40 MG DR capsule Take by mouth     No current facility-administered medications on file prior to visit.    Family History  Problem Relation Age of Onset   Hyperlipidemia (Elevated cholesterol) Mother    High blood pressure (Hypertension) Father    Coronary Artery Disease (Blocked arteries around heart) Father      Social History   Tobacco Use  Smoking Status Never  Smokeless Tobacco Never     Social History   Socioeconomic History   Marital status: Married  Tobacco Use   Smoking status: Never   Smokeless tobacco: Never  Vaping Use   Vaping Use: Never used  Substance and Sexual Activity   Alcohol use: Yes    Alcohol/week: 2.0 standard drinks of alcohol    Types: 2 Standard drinks or equivalent per week   Drug use: Never    Objective:    Vitals:     03/26/22 1022 03/26/22 1103  BP: (!) 156/94 128/84  Pulse: 68   Temp: 36.8 C (98.3 F)   SpO2: 99%   Weight: 97.2 kg (214 lb 3.2 oz)   Height: 188 cm (6' 2")   PainSc: 0-No pain     Body mass index is 27.5 kg/Ryan.  [name], well-appearing Unlabored respirations Abdomen is soft, nontender, nondistended.  Reducible umbilical hernia.  Incisions from his robotic partial nephrectomy are not really visible.  Assessment and Plan:  Diagnoses and all orders for this visit:  Cholecystitis, chronic  Incisional hernia, without obstruction or gangrene    We had a long  discussion today going over his workup in the past as well as his symptoms.  His pain has been very focal, in the epigastric region and he does have some other symptoms that do go along with at least biliary dyskinesia or biliary colic.  He has had a fairly extensive workup with the only abnormal finding officially reported being the positive HIDA scan, though on my review of his CTA from last year I think I can see some small gallstones.  We discussed also other possible etiologies of his pain including esophageal spasm, but given the whole picture I think it is reasonable to proceed with laparoscopic cholecystectomy.  I discussed with him the relevant anatomy of the biliary tract, using a diagram to demonstrate, and we went over the technique of laparoscopic cholecystectomy. Discussed risks of surgery including bleeding, pain, scarring, intraabdominal injury specifically to the common bile duct and sequelae, bile leak, conversion to open surgery, failure to resolve symptoms, blood clots/ pulmonary embolus, heart attack, pneumonia, stroke, etc. Questions welcomed and answered to patient's satisfaction.  He wishes to proceed with surgery.  Parker Ryan Parker Cherell Colvin, MD   

## 2022-03-31 NOTE — Patient Instructions (Addendum)
SURGICAL WAITING ROOM VISITATION Patients having surgery or a procedure may have no more than 2 support people in the waiting area - these visitors may rotate.    If the patient needs to stay at the hospital during part of their recovery, the visitor guidelines for inpatient rooms apply. Pre-op nurse will coordinate an appropriate time for 1 support person to accompany patient in pre-op.  This support person may not rotate.    Please refer to the [facility] website for the visitor guidelines for Inpatients (after your surgery is over and you are in a regular room).   Due to an increase in RSV and influenza rates and associated hospitalizations, children ages 36 and under may not visit patients in [place].     Your procedure is scheduled on: 04-11-22   Report to [facility] Main Entrance    Report to admitting at 5:15 AM   Call this number if you have problems the morning of surgery [phone]   Eat a light diet the day before surgery.  Examples including soups, broths, toast, yogurt, mashed potatoes.  Things to avoid include carbonated beverages (fizzy beverages), raw fruits and raw vegetables, or beans.   If your bowels are filled with gas, your surgeon will have difficulty visualizing your pelvic organs which increases your surgical risks.  Do not eat food or drink liquids :After Midnight.          If you have questions, please contact your surgeon's office.  FOLLOW  ANY ADDITIONAL PRE OP INSTRUCTIONS YOU RECEIVED FROM YOUR SURGEON'S OFFICE!!!     Oral Hygiene is also important to reduce your risk of infection.                                    Remember - BRUSH YOUR TEETH THE MORNING OF SURGERY WITH YOUR REGULAR TOOTHPASTE   Do NOT smoke after Midnight   Take these medicines the morning of surgery with A SIP OF WATER:   Colchicine  Famotidine  Fenofibrate  Omeprazole                              You may not have any metal on your body including   jewelry, and body piercing             Do not wear lotions, powders, cologne, or deodorant              Men may shave face and neck.   Do not bring valuables to the hospital. [place].   Contacts, dentures or bridgework may not be worn into surgery.  DO NOT [place]. PHARMACY WILL DISPENSE MEDICATIONS LISTED ON YOUR MEDICATION LIST TO YOU DURING YOUR ADMISSION [place]!    Patients discharged on the day of surgery will not be allowed to drive home.  Someone NEEDS to stay with you for the first 24 hours after anesthesia.   Special Instructions: Bring a copy of your healthcare power of attorney and living will documents the day of surgery if you haven't scanned them before.              Please read over the following fact sheets you were given: IF [place]  If you  received a COVID test during your pre-op visit  it is requested that you wear a mask when out in public, stay away from anyone that may not be feeling well and notify your surgeon if you develop symptoms. If you test positive for Covid or have been in contact with anyone that has tested positive in the last 10 days please notify you surgeon.  [place] - Preparing for Surgery Before surgery, you can play an important role.  Because skin is not sterile, your skin needs to be as free of germs as possible.  You can reduce the number of germs on your skin by washing with CHG (chlorahexidine gluconate) soap before surgery.  CHG is an antiseptic cleaner which kills germs and bonds with the skin to continue killing germs even after washing. Please DO NOT use if you have an allergy to CHG or antibacterial soaps.  If your skin becomes reddened/irritated stop using the CHG and inform your nurse when you arrive at Short Stay. Do not shave (including legs and underarms) for at least 48 hours prior  to the first CHG shower.  You may shave your face/neck.  Please follow these instructions carefully:  1.  Shower with CHG Soap the night before surgery and the  morning of surgery.  2.  If you choose to wash your hair, wash your hair first as usual with your normal  shampoo.  3.  After you shampoo, rinse your hair and body thoroughly to remove the shampoo.                             4.  Use CHG as you would any other liquid soap.  You can apply chg directly to the skin and wash.  Gently with a scrungie or clean washcloth.  5.  Apply the CHG Soap to your body ONLY FROM THE NECK DOWN.   Do   not use on face/ open                           Wound or open sores. Avoid contact with eyes, ears mouth and   genitals (private parts).                       Wash face,  Genitals (private parts) with your normal soap.             6.  Wash thoroughly, paying special attention to the area where your    surgery  will be performed.  7.  Thoroughly rinse your body with warm water from the neck down.  8.  DO NOT shower/wash with your normal soap after using and rinsing off the CHG Soap.                9.  Pat yourself dry with a clean towel.            10.  Wear clean pajamas.            11.  Place clean sheets on your bed the night of your first shower and do not  sleep with pets. Day of Surgery : Do not apply any lotions/deodorants the morning of surgery.  Please wear clean clothes to the hospital/surgery center.  FAILURE TO FOLLOW THESE INSTRUCTIONS MAY RESULT IN THE CANCELLATION OF YOUR SURGERY  PATIENT SIGNATURE_________________________________  NURSE SIGNATURE__________________________________  ________________________________________________________________________

## 2022-04-02 ENCOUNTER — Other Ambulatory Visit: Payer: Self-pay

## 2022-04-02 ENCOUNTER — Encounter (HOSPITAL_COMMUNITY)
Admission: RE | Admit: 2022-04-02 | Discharge: 2022-04-02 | Disposition: A | Discharge status: Home / Self Care | Payer: BC Managed Care – PPO | Source: Ambulatory Visit | Attending: Surgery | Admitting: Surgery

## 2022-04-02 ENCOUNTER — Encounter (HOSPITAL_COMMUNITY): Payer: Self-pay

## 2022-04-02 VITALS — BP 140/102 | HR 61 | Temp 98.0°F | Ht 74.0 in | Wt 211.0 lb

## 2022-04-02 DIAGNOSIS — I10 Essential (primary) hypertension: Secondary | ICD-10-CM | POA: Insufficient documentation

## 2022-04-02 DIAGNOSIS — Z01818 Encounter for other preprocedural examination: Secondary | ICD-10-CM | POA: Insufficient documentation

## 2022-04-02 LAB — CBC
HCT: 48 % (ref 39.0–52.0)
Hemoglobin: 16.1 g/dL (ref 13.0–17.0)
MCH: 30.9 pg (ref 26.0–34.0)
MCHC: 33.5 g/dL (ref 30.0–36.0)
MCV: 92.1 fL (ref 80.0–100.0)
Platelets: 279 10*3/uL (ref 150–400)
RBC: 5.21 MIL/uL (ref 4.22–5.81)
RDW: 12.8 % (ref 11.5–15.5)
WBC: 8.5 10*3/uL (ref 4.0–10.5)
nRBC: 0 % (ref 0.0–0.2)

## 2022-04-02 LAB — BASIC METABOLIC PANEL
Anion gap: 8 (ref 5–15)
BUN: 16 mg/dL (ref 6–20)
CO2: 25 mmol/L (ref 22–32)
Calcium: 8.9 mg/dL (ref 8.9–10.3)
Chloride: 106 mmol/L (ref 98–111)
Creatinine, Ser: 1.51 mg/dL — ABNORMAL HIGH (ref 0.61–1.24)
GFR, Estimated: 54 mL/min — ABNORMAL LOW (ref >60)
Glucose, Bld: 97 mg/dL (ref 70–99)
Potassium: 4 mmol/L (ref 3.5–5.1)
Sodium: 139 mmol/L (ref 135–145)

## 2022-04-02 NOTE — Progress Notes (Signed)
For Short Stay: [place] appointment date:  Bowel Prep reminder:   For Anesthesia: PCP - Dr. Shelba Flake Cardiologist - N/A  Chest x-ray -  EKG -  Stress Test -  ECHO -  Cardiac Cath -  Pacemaker/ICD device last checked: Pacemaker orders received: Device Rep notified:  Spinal Cord Stimulator:  Sleep Study -  CPAP -   Fasting Blood Sugar -  Checks Blood Sugar _____ times a day Date and result of last Hgb A1c-  Last dose of GLP1 agonist-  GLP1 instructions:   Last dose of SGLT-2 inhibitors-  SGLT-2 instructions:   Blood Thinner Instructions: Aspirin Instructions: Last Dose:  Activity level: Can go up a flight of stairs and activities of daily living without stopping and without chest pain and/or shortness of breath   Able to exercise without chest pain and/or shortness of breath  Anesthesia review:   Patient denies shortness of breath, fever, cough and chest pain at PAT appointment   Patient verbalized understanding of instructions that were given to them at the PAT appointment. Patient was also instructed that they will need to review over the PAT instructions again at home before surgery.

## 2022-04-10 NOTE — Anesthesia Preprocedure Evaluation (Addendum)
Anesthesia Evaluation  Patient identified by MRN, date of birth, ID band Patient awake    Reviewed: Allergy & Precautions, NPO status , Patient's Chart, lab work & pertinent test results  Airway Mallampati: II  TM Distance: >3 FB Neck ROM: Full    Dental no notable dental hx.    Pulmonary neg pulmonary ROS   Pulmonary exam normal        Cardiovascular hypertension, Pt. on medications  Rhythm:Regular Rate:Normal     Neuro/Psych  Headaches  negative psych ROS   GI/Hepatic Neg liver ROS,GERD  Medicated,,Cholecystitis    Endo/Other  negative endocrine ROS    Renal/GU   negative genitourinary   Musculoskeletal  (+) Arthritis , Osteoarthritis,    Abdominal Normal abdominal exam  (+)   Peds  Hematology negative hematology ROS (+)   Anesthesia Other Findings   Reproductive/Obstetrics                             Anesthesia Physical Anesthesia Plan  ASA: 2  Anesthesia Plan: General   Post-op Pain Management:    Induction: Intravenous  PONV Risk Score and Plan: 2 and Ondansetron, Dexamethasone, Midazolam and Treatment may vary due to age or medical condition  Airway Management Planned: Mask and Oral ETT  Additional Equipment: None  Intra-op Plan:   Post-operative Plan: Extubation in OR  Informed Consent: I have reviewed the patients History and Physical, chart, labs and discussed the procedure including the risks, benefits and alternatives for the proposed anesthesia with the patient or authorized representative who has indicated his/her understanding and acceptance.     Dental advisory given  Plan Discussed with: CRNA  Anesthesia Plan Comments: (Lab Results      Component                Value               Date                      WBC                      8.5                 04/02/2022                HGB                      16.1                04/02/2022                HCT                       48.0                04/02/2022                MCV                      92.1                04/02/2022                PLT                      279  04/02/2022             Lab Results      Component                Value               Date                      NA                       139                 04/02/2022                K                        4.0                 04/02/2022                CO2                      25                  04/02/2022                GLUCOSE                  97                  04/02/2022                BUN                      16                  04/02/2022                CREATININE               1.51 (H)            04/02/2022                CALCIUM                  8.9                 04/02/2022                GFRNONAA                 54 (L)              04/02/2022           )       Anesthesia Quick Evaluation

## 2022-04-11 ENCOUNTER — Encounter (HOSPITAL_COMMUNITY)
Admission: RE | Disposition: A | Discharge status: Home / Self Care | Payer: Self-pay | Source: Home / Self Care | Attending: Surgery

## 2022-04-11 ENCOUNTER — Other Ambulatory Visit: Payer: Self-pay

## 2022-04-11 ENCOUNTER — Ambulatory Visit (HOSPITAL_COMMUNITY): Payer: BC Managed Care – PPO | Admitting: Certified Registered"

## 2022-04-11 ENCOUNTER — Ambulatory Visit (HOSPITAL_COMMUNITY)
Admission: RE | Admit: 2022-04-11 | Discharge: 2022-04-11 | Disposition: A | Discharge status: Home / Self Care | Payer: BC Managed Care – PPO | Attending: Surgery | Admitting: Surgery

## 2022-04-11 ENCOUNTER — Encounter (HOSPITAL_COMMUNITY): Payer: Self-pay | Admitting: Surgery

## 2022-04-11 DIAGNOSIS — K660 Peritoneal adhesions (postprocedural) (postinfection): Secondary | ICD-10-CM | POA: Diagnosis not present

## 2022-04-11 DIAGNOSIS — Z905 Acquired absence of kidney: Secondary | ICD-10-CM | POA: Insufficient documentation

## 2022-04-11 DIAGNOSIS — K821 Hydrops of gallbladder: Secondary | ICD-10-CM | POA: Diagnosis not present

## 2022-04-11 DIAGNOSIS — Z85528 Personal history of other malignant neoplasm of kidney: Secondary | ICD-10-CM | POA: Diagnosis not present

## 2022-04-11 DIAGNOSIS — K820 Obstruction of gallbladder: Secondary | ICD-10-CM | POA: Diagnosis not present

## 2022-04-11 DIAGNOSIS — K8012 Calculus of gallbladder with acute and chronic cholecystitis without obstruction: Secondary | ICD-10-CM | POA: Diagnosis not present

## 2022-04-11 DIAGNOSIS — I10 Essential (primary) hypertension: Secondary | ICD-10-CM | POA: Diagnosis not present

## 2022-04-11 DIAGNOSIS — Z79899 Other long term (current) drug therapy: Secondary | ICD-10-CM | POA: Diagnosis not present

## 2022-04-11 DIAGNOSIS — M1990 Unspecified osteoarthritis, unspecified site: Secondary | ICD-10-CM | POA: Insufficient documentation

## 2022-04-11 DIAGNOSIS — K8064 Calculus of gallbladder and bile duct with chronic cholecystitis without obstruction: Secondary | ICD-10-CM | POA: Diagnosis not present

## 2022-04-11 DIAGNOSIS — K8010 Calculus of gallbladder with chronic cholecystitis without obstruction: Secondary | ICD-10-CM | POA: Diagnosis not present

## 2022-04-11 DIAGNOSIS — K420 Umbilical hernia with obstruction, without gangrene: Secondary | ICD-10-CM | POA: Diagnosis not present

## 2022-04-11 DIAGNOSIS — K219 Gastro-esophageal reflux disease without esophagitis: Secondary | ICD-10-CM | POA: Diagnosis not present

## 2022-04-11 HISTORY — PX: CHOLECYSTECTOMY: SHX55

## 2022-04-11 HISTORY — PX: UMBILICAL HERNIA REPAIR: SHX196

## 2022-04-11 SURGERY — LAPAROSCOPIC CHOLECYSTECTOMY
Anesthesia: General

## 2022-04-11 MED ORDER — ONDANSETRON HCL 4 MG/2ML IJ SOLN
INTRAMUSCULAR | Status: AC
  Filled 2022-04-11: qty 2

## 2022-04-11 MED ORDER — FENTANYL CITRATE PF 50 MCG/ML IJ SOSY
PREFILLED_SYRINGE | INTRAMUSCULAR | Status: AC
  Filled 2022-04-11: qty 2

## 2022-04-11 MED ORDER — LACTATED RINGERS IV SOLN: INTRAVENOUS | Status: DC

## 2022-04-11 MED ORDER — MIDAZOLAM HCL 5 MG/5ML IJ SOLN
INTRAMUSCULAR | Status: DC | PRN
  Administered 2022-04-11: 2 mg via INTRAVENOUS

## 2022-04-11 MED ORDER — OXYCODONE HCL 5 MG PO TABS
5.0000 mg | ORAL_TABLET | ORAL | Status: DC | PRN
  Administered 2022-04-11: 5 mg via ORAL

## 2022-04-11 MED ORDER — OXYCODONE HCL 5 MG PO TABS
ORAL_TABLET | ORAL | Status: AC
  Filled 2022-04-11: qty 1

## 2022-04-11 MED ORDER — FENTANYL CITRATE (PF) 100 MCG/2ML IJ SOLN
INTRAMUSCULAR | Status: AC
  Filled 2022-04-11: qty 2

## 2022-04-11 MED ORDER — ACETAMINOPHEN 500 MG PO TABS
1000.0000 mg | ORAL_TABLET | ORAL | Status: AC
  Administered 2022-04-11: 1000 mg via ORAL
  Filled 2022-04-11: qty 2

## 2022-04-11 MED ORDER — HEMOSTATIC AGENTS (NO CHARGE) OPTIME
TOPICAL | Status: DC | PRN
  Administered 2022-04-11: 1

## 2022-04-11 MED ORDER — CHLORHEXIDINE GLUCONATE 4 % EX LIQD: 60.0000 mL | Freq: Once | CUTANEOUS | Status: DC

## 2022-04-11 MED ORDER — SODIUM CHLORIDE 0.9% FLUSH: 3.0000 mL | Freq: Two times a day (BID) | INTRAVENOUS | Status: DC

## 2022-04-11 MED ORDER — ROCURONIUM BROMIDE 10 MG/ML (PF) SYRINGE
PREFILLED_SYRINGE | INTRAVENOUS | Status: DC | PRN
  Administered 2022-04-11: 60 mg via INTRAVENOUS
  Administered 2022-04-11: 20 mg via INTRAVENOUS

## 2022-04-11 MED ORDER — LIDOCAINE 2% (20 MG/ML) 5 ML SYRINGE
INTRAMUSCULAR | Status: DC | PRN
  Administered 2022-04-11: 80 mg via INTRAVENOUS

## 2022-04-11 MED ORDER — PHENYLEPHRINE 80 MCG/ML (10ML) SYRINGE FOR IV PUSH (FOR BLOOD PRESSURE SUPPORT)
PREFILLED_SYRINGE | INTRAVENOUS | Status: DC | PRN
  Administered 2022-04-11: 160 ug via INTRAVENOUS
  Administered 2022-04-11 (×2): 120 ug via INTRAVENOUS
  Administered 2022-04-11: 160 ug via INTRAVENOUS

## 2022-04-11 MED ORDER — MIDAZOLAM HCL 2 MG/2ML IJ SOLN
INTRAMUSCULAR | Status: AC
  Filled 2022-04-11: qty 2

## 2022-04-11 MED ORDER — SODIUM CHLORIDE 0.9% FLUSH: 3.0000 mL | INTRAVENOUS | Status: DC | PRN

## 2022-04-11 MED ORDER — BUPIVACAINE HCL (PF) 0.25 % IJ SOLN
INTRAMUSCULAR | Status: DC | PRN
  Administered 2022-04-11: 30 mL

## 2022-04-11 MED ORDER — ACETAMINOPHEN 325 MG PO TABS: 650.0000 mg | ORAL_TABLET | ORAL | Status: DC | PRN

## 2022-04-11 MED ORDER — ACETAMINOPHEN 650 MG RE SUPP: 650.0000 mg | RECTAL | Status: DC | PRN

## 2022-04-11 MED ORDER — 0.9 % SODIUM CHLORIDE (POUR BTL) OPTIME
TOPICAL | Status: DC | PRN
  Administered 2022-04-11: 1000 mL

## 2022-04-11 MED ORDER — PROPOFOL 10 MG/ML IV BOLUS
INTRAVENOUS | Status: DC | PRN
  Administered 2022-04-11: 180 mg via INTRAVENOUS

## 2022-04-11 MED ORDER — FENTANYL CITRATE PF 50 MCG/ML IJ SOSY: 25.0000 ug | PREFILLED_SYRINGE | INTRAMUSCULAR | Status: DC | PRN

## 2022-04-11 MED ORDER — ROCURONIUM BROMIDE 10 MG/ML (PF) SYRINGE
PREFILLED_SYRINGE | INTRAVENOUS | Status: AC
  Filled 2022-04-11: qty 10

## 2022-04-11 MED ORDER — SUGAMMADEX SODIUM 200 MG/2ML IV SOLN
INTRAVENOUS | Status: DC | PRN
  Administered 2022-04-11: 200 mg via INTRAVENOUS

## 2022-04-11 MED ORDER — FENTANYL CITRATE (PF) 250 MCG/5ML IJ SOLN
INTRAMUSCULAR | Status: AC
  Filled 2022-04-11: qty 5

## 2022-04-11 MED ORDER — DEXAMETHASONE SODIUM PHOSPHATE 4 MG/ML IJ SOLN
INTRAMUSCULAR | Status: DC | PRN
  Administered 2022-04-11: 5 mg via INTRAVENOUS

## 2022-04-11 MED ORDER — BUPIVACAINE LIPOSOME 1.3 % IJ SUSP: 20.0000 mL | Freq: Once | INTRAMUSCULAR | Status: DC

## 2022-04-11 MED ORDER — PHENYLEPHRINE 80 MCG/ML (10ML) SYRINGE FOR IV PUSH (FOR BLOOD PRESSURE SUPPORT)
PREFILLED_SYRINGE | INTRAVENOUS | Status: AC
  Filled 2022-04-11: qty 10

## 2022-04-11 MED ORDER — LACTATED RINGERS IR SOLN
Status: DC | PRN
  Administered 2022-04-11: 1000 mL

## 2022-04-11 MED ORDER — CHLORHEXIDINE GLUCONATE 0.12 % MT SOLN
15.0000 mL | Freq: Once | OROMUCOSAL | Status: AC
  Administered 2022-04-11: 15 mL via OROMUCOSAL

## 2022-04-11 MED ORDER — BUPIVACAINE HCL (PF) 0.25 % IJ SOLN
INTRAMUSCULAR | Status: AC
  Filled 2022-04-11: qty 30

## 2022-04-11 MED ORDER — ONDANSETRON HCL 4 MG/2ML IJ SOLN
INTRAMUSCULAR | Status: DC | PRN
  Administered 2022-04-11: 4 mg via INTRAVENOUS

## 2022-04-11 MED ORDER — DOCUSATE SODIUM 100 MG PO CAPS: 100.0000 mg | ORAL_CAPSULE | Freq: Two times a day (BID) | ORAL | 0 refills | Status: AC

## 2022-04-11 MED ORDER — PROPOFOL 500 MG/50ML IV EMUL
INTRAVENOUS | Status: AC
  Filled 2022-04-11: qty 50

## 2022-04-11 MED ORDER — ORAL CARE MOUTH RINSE: 15.0000 mL | Freq: Once | OROMUCOSAL | Status: AC

## 2022-04-11 MED ORDER — DEXAMETHASONE SODIUM PHOSPHATE 10 MG/ML IJ SOLN
INTRAMUSCULAR | Status: AC
  Filled 2022-04-11: qty 1

## 2022-04-11 MED ORDER — TRAMADOL HCL 50 MG PO TABS: 50.0000 mg | ORAL_TABLET | Freq: Four times a day (QID) | ORAL | 0 refills | Status: AC | PRN

## 2022-04-11 MED ORDER — SODIUM CHLORIDE 0.9 % IV SOLN: 250.0000 mL | INTRAVENOUS | Status: DC | PRN

## 2022-04-11 MED ORDER — GABAPENTIN 300 MG PO CAPS
300.0000 mg | ORAL_CAPSULE | ORAL | Status: AC
  Administered 2022-04-11: 300 mg via ORAL
  Filled 2022-04-11: qty 1

## 2022-04-11 MED ORDER — FENTANYL CITRATE (PF) 100 MCG/2ML IJ SOLN
INTRAMUSCULAR | Status: DC | PRN
  Administered 2022-04-11: 50 ug via INTRAVENOUS
  Administered 2022-04-11: 100 ug via INTRAVENOUS
  Administered 2022-04-11 (×4): 50 ug via INTRAVENOUS

## 2022-04-11 MED ORDER — FENTANYL CITRATE PF 50 MCG/ML IJ SOSY
25.0000 ug | PREFILLED_SYRINGE | INTRAMUSCULAR | Status: DC | PRN
  Administered 2022-04-11: 50 ug via INTRAVENOUS

## 2022-04-11 MED ORDER — CEFAZOLIN SODIUM-DEXTROSE 2-4 GM/100ML-% IV SOLN
2.0000 g | INTRAVENOUS | Status: AC
  Administered 2022-04-11: 2 g via INTRAVENOUS
  Filled 2022-04-11: qty 100

## 2022-04-11 MED ORDER — LIDOCAINE HCL (PF) 2 % IJ SOLN
INTRAMUSCULAR | Status: AC
  Filled 2022-04-11: qty 5

## 2022-04-11 SURGICAL SUPPLY — 61 items
ADH SKN CLS APL DERMABOND .7 (GAUZE/BANDAGES/DRESSINGS) ×1
APL PRP STRL LF DISP 70% ISPRP (MISCELLANEOUS) ×1
APL SKNCLS STERI-STRIP NONHPOA (GAUZE/BANDAGES/DRESSINGS)
APPLIER CLIP 5 13 M/L LIGAMAX5 (MISCELLANEOUS) ×1
APPLIER CLIP ROT 10 11.4 M/L (STAPLE)
APR CLP MED LRG 11.4X10 (STAPLE)
APR CLP MED LRG 5 ANG JAW (MISCELLANEOUS) ×1
BAG COUNTER SPONGE SURGICOUNT (BAG) IMPLANT
BAG SPNG CNTER NS LX DISP (BAG)
BENZOIN TINCTURE PRP APPL 2/3 (GAUZE/BANDAGES/DRESSINGS) IMPLANT
CABLE HIGH FREQUENCY MONO STRZ (ELECTRODE) ×1 IMPLANT
CHLORAPREP W/TINT 26 (MISCELLANEOUS) ×1 IMPLANT
CLIP APPLIE 5 13 M/L LIGAMAX5 (MISCELLANEOUS) IMPLANT
CLIP APPLIE ROT 10 11.4 M/L (STAPLE) ×1 IMPLANT
COVER MAYO STAND STRL (DRAPES) IMPLANT
COVER SURGICAL LIGHT HANDLE (MISCELLANEOUS) ×1 IMPLANT
DERMABOND ADVANCED .7 DNX12 (GAUZE/BANDAGES/DRESSINGS) ×1 IMPLANT
DRAPE C-ARM 42X120 X-RAY (DRAPES) IMPLANT
DRAPE LAPAROSCOPIC ABDOMINAL (DRAPES) ×1 IMPLANT
ELECT REM PT RETURN 15FT ADLT (MISCELLANEOUS) ×1 IMPLANT
ENDOLOOP SUT PDS II  0 18 (SUTURE) ×2
ENDOLOOP SUT PDS II 0 18 (SUTURE) IMPLANT
GAUZE SPONGE 4X4 12PLY STRL (GAUZE/BANDAGES/DRESSINGS) IMPLANT
GLOVE BIO SURGEON STRL SZ 6 (GLOVE) ×1 IMPLANT
GLOVE INDICATOR 6.5 STRL GRN (GLOVE) ×1 IMPLANT
GLOVE SS BIOGEL STRL SZ 6 (GLOVE) ×1 IMPLANT
GOWN STRL REUS W/ TWL LRG LVL3 (GOWN DISPOSABLE) ×1 IMPLANT
GOWN STRL REUS W/ TWL XL LVL3 (GOWN DISPOSABLE) IMPLANT
GOWN STRL REUS W/TWL LRG LVL3 (GOWN DISPOSABLE) ×1
GOWN STRL REUS W/TWL XL LVL3 (GOWN DISPOSABLE)
GRASPER SUT TROCAR 14GX15 (MISCELLANEOUS) ×1 IMPLANT
HEMOSTAT SNOW SURGICEL 2X4 (HEMOSTASIS) IMPLANT
IRRIG SUCT STRYKERFLOW 2 WTIP (MISCELLANEOUS) ×1
IRRIGATION SUCT STRKRFLW 2 WTP (MISCELLANEOUS) ×1 IMPLANT
KIT BASIN OR (CUSTOM PROCEDURE TRAY) ×1 IMPLANT
KIT TURNOVER KIT A (KITS) IMPLANT
NDL INSUFFLATION 14GA 120MM (NEEDLE) ×1 IMPLANT
NEEDLE HYPO 22GX1.5 SAFETY (NEEDLE) IMPLANT
NEEDLE INSUFFLATION 14GA 120MM (NEEDLE) ×1 IMPLANT
PACK GENERAL/GYN (CUSTOM PROCEDURE TRAY) ×1 IMPLANT
PENCIL SMOKE EVACUATOR (MISCELLANEOUS) IMPLANT
SCISSORS LAP 5X35 DISP (ENDOMECHANICALS) ×1 IMPLANT
SET CHOLANGIOGRAPH MIX (MISCELLANEOUS) IMPLANT
SET TUBE SMOKE EVAC HIGH FLOW (TUBING) ×1 IMPLANT
SLEEVE Z-THREAD 5X100MM (TROCAR) ×1 IMPLANT
SPIKE FLUID TRANSFER (MISCELLANEOUS) ×1 IMPLANT
STRIP CLOSURE SKIN 1/2X4 (GAUZE/BANDAGES/DRESSINGS) IMPLANT
SUT ETHIBOND 0 MO6 C/R (SUTURE) IMPLANT
SUT MNCRL AB 4-0 PS2 18 (SUTURE) ×1 IMPLANT
SUT PROLENE 2 0 CT2 30 (SUTURE) IMPLANT
SUT VIC AB 3-0 SH 27 (SUTURE) ×1
SUT VIC AB 3-0 SH 27XBRD (SUTURE) IMPLANT
SYR CONTROL 10ML LL (SYRINGE) IMPLANT
SYS BAG RETRIEVAL 10MM (BASKET) ×1
SYSTEM BAG RETRIEVAL 10MM (BASKET) ×1 IMPLANT
TOWEL OR 17X26 10 PK STRL BLUE (TOWEL DISPOSABLE) ×1 IMPLANT
TOWEL OR NON WOVEN STRL DISP B (DISPOSABLE) ×1 IMPLANT
TRAY LAPAROSCOPIC (CUSTOM PROCEDURE TRAY) ×1 IMPLANT
TROCAR ADV FIXATION 12X100MM (TROCAR) ×1 IMPLANT
TROCAR XCEL NON-BLD 5MMX100MML (ENDOMECHANICALS) IMPLANT
TROCAR Z-THREAD OPTICAL 5X100M (TROCAR) ×1 IMPLANT

## 2022-04-11 NOTE — Interval H&P Note (Signed)
History and Physical Interval Note:  04/11/2022 7:09 AM  Parker Ryan  has presented today for surgery, with the diagnosis of CHOLECYSTITIS.  The various methods of treatment have been discussed with the patient and family. After consideration of risks, benefits and other options for treatment, the patient has consented to  Procedure(s): LAPAROSCOPIC CHOLECYSTECTOMY (N/A) HERNIA REPAIR UMBILICAL ADULT (N/A) as a surgical intervention.  The patient's history has been reviewed, patient examined, no change in status, stable for surgery.  I have reviewed the patient's chart and labs.  Questions were answered to the patient's satisfaction.     Daman Steffenhagen Rich Brave

## 2022-04-11 NOTE — Discharge Instructions (Signed)
LAPAROSCOPIC SURGERY: POST OP INSTRUCTIONS   EAT Gradually transition to a high fiber diet with a fiber supplement over the next few weeks after discharge.  Start with a pureed / full liquid diet (see below)  WALK Walk an hour a day (cumulative- not all at once).  Control your pain to do that.    CONTROL PAIN Control pain so that you can walk, sleep, tolerate sneezing/coughing, go up/down stairs.  HAVE A BOWEL MOVEMENT DAILY Keep your bowels regular to avoid problems.  OK to try a laxative to override constipation.  OK to use an antidiarrheal to slow down diarrhea.  Call if not better after 2 tries  CALL IF YOU HAVE PROBLEMS/CONCERNS Call if you are still struggling despite following these instructions. Call if you have concerns not answered by these instructions    DIET: Follow a light bland diet & liquids the first 24 hours after arrival home, such as soup, liquids, starches, etc.  Be sure to drink plenty of fluids.  Quickly advance to a usual solid diet within a few days.  Avoid fast food or heavy meals initially as you are more likely to get nauseated or have irregular bowels.    Take your usually prescribed home medications unless otherwise directed.  PAIN CONTROL: Pain is best controlled by a usual combination of three different methods TOGETHER: Ice/Heat Over the counter pain medication Prescription pain medication Most patients will experience some swelling and bruising around the incisions.  Ice packs or heating pads (30-60 minutes up to 6 times a day) will help. Use ice for the first few days to help decrease swelling and bruising, then switch to heat to help relax tight/sore spots and speed recovery.  Some people prefer to use ice alone, heat alone, alternating between ice & heat.  Experiment to what works for you.  Swelling and bruising can take several weeks to resolve.   It is helpful to take an over-the-counter pain medication regularly for the first few days: Naproxen  (Aleve, etc)  Two 235m tabs twice a day OR Ibuprofen (Advil, etc) Three 2062mtabs four times a day (every meal & bedtime) AND Acetaminophen (Tylenol, etc) 500-65076mour times a day (every meal & bedtime) A  prescription for pain medication (such as oxycodone, hydrocodone, tramadol, gabapentin, methocarbamol, etc) should be given to you upon discharge.  Take your pain medication as prescribed, IF NEEDED.  If you are having problems/concerns with the prescription medicine (does not control pain, nausea, vomiting, rash, itching, etc), please call us [phone] see if we need to switch you to a different pain medicine that will work better for you and/or control your side effect better. If you need a refill on your pain medication, please give us [place] hour notice.  contact your pharmacy.  They will contact our office to request authorization. Prescriptions will not be filled after 5 pm or on week-ends  Avoid getting constipated.   Between the surgery and the pain medications, it is common to experience some constipation.   Increasing fluid intake and taking a fiber supplement (such as Metamucil, Citrucel, FiberCon, MiraLax, etc) 1-2 times a day regularly will usually help prevent this problem from occurring.   A mild laxative (prune juice, Milk of Magnesia, MiraLax, etc) should be taken according to package directions if there are no bowel movements after 48 hours.   Watch out for diarrhea.   If you have many loose bowel movements, simplify your diet to bland foods & liquids  for a few days.   Stop any stool softeners and decrease your fiber supplement.   Switching to mild anti-diarrheal medications (Kayopectate, Pepto Bismol) can help.   If this worsens or does not improve, please call us.  Wash / shower every day.  You may shower over the skin glue which is waterproof.  Do not soak or submerge incisions.  No rubbing, scrubbing, lotions or ointments to incisions.  Glue will flake off after  about 2 weeks.  You may leave the incision open to air.  You may replace a dressing/Band-Aid to cover the incision for comfort if you wish.   ACTIVITIES as tolerated:   You may resume regular (light) daily activities beginning the next day--such as daily self-care, walking, climbing stairs--gradually increasing activities as tolerated.  If you can walk 30 minutes without difficulty, it is safe to try more intense activity such as jogging, treadmill, bicycling, low-impact aerobics, swimming, etc. Save the most intensive and strenuous activity for last such as sit-ups, heavy lifting, contact sports, etc  Refrain from any heavy lifting or straining until you are off narcotics for pain control.   DO NOT PUSH THROUGH PAIN.  Let pain be your guide: If it hurts to do something, don't do it.  Pain is your body warning you to avoid that activity for another week until the pain goes down. You may drive when you are no longer taking prescription pain medication, you can comfortably wear a seatbelt, and you can safely maneuver your car and apply brakes. You may have sexual intercourse when it is comfortable.  FOLLOW UP in our office Please call [facility] at [phone] to set up an appointment to see your surgeon in the office for a follow-up appointment approximately 2-3 weeks after your surgery. Make sure that you call for this appointment the day you arrive home to insure a convenient appointment time.  10. IF YOU HAVE DISABILITY OR FAMILY LEAVE FORMS, BRING THEM TO THE OFFICE FOR PROCESSING.  DO NOT GIVE THEM TO YOUR DOCTOR.   WHEN TO CALL us [phone]: Poor pain control Reactions / problems with new medications (rash/itching, nausea, etc)  Fever over 101.5 F (38.5 C) Inability to urinate Nausea and/or vomiting Worsening swelling or bruising Continued bleeding from incision. Increased pain, redness, or drainage from the incision   The clinic staff is available to answer your questions during  regular business hours (8:30am-5pm).  Please don't hesitate to call and ask to speak to one of our nurses for clinical concerns.   If you have a medical emergency, go to the nearest emergency room or call 911.  A surgeon from [facility] is always on call at the [facility], [place], [place], [place], [place]  [address] ? MAIN: [phone] ? TOLL FREE: [phone] ?  FAX [phone] [email or web address]

## 2022-04-11 NOTE — Anesthesia Procedure Notes (Signed)
Procedure Name: Intubation Date/Time: 04/11/2022 7:35 AM  Performed by: Claudia Desanctis, CRNAPre-anesthesia Checklist: Patient identified, Emergency Drugs available, Suction available and Patient being monitored Patient Re-evaluated:Patient Re-evaluated prior to induction Oxygen Delivery Method: Circle system utilized Preoxygenation: Pre-oxygenation with 100% oxygen Induction Type: IV induction Ventilation: Mask ventilation without difficulty Laryngoscope Size: Miller and 3 Grade View: Grade I Tube type: Oral Tube size: 7.5 mm Number of attempts: 1 Airway Equipment and Method: Stylet Placement Confirmation: ETT inserted through vocal cords under direct vision, positive ETCO2 and breath sounds checked- equal and bilateral Secured at: 22 cm Tube secured with: Tape Dental Injury: Teeth and Oropharynx as per pre-operative assessment

## 2022-04-11 NOTE — Op Note (Signed)
Operative Note  Parker Ryan 57 y.o. 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 Antibiotics were administered. SCD's were applied. General endotracheal anesthesia was initiated and a formal time-out was performed. The abdomen was prepped and draped in the usual sterile fashion.  A curvilinear infraumbilical incision was made and dissection continued with cautery until the umbilical hernia sac was encountered.  The umbilical stalk and hernia sac were encircled with a tonsil and then the umbilical stalk was divided from the underlying hernia sac which was then entered and noted to contain preperitoneal fat.  The hernia sac was excised at the level of the fascia and the defect was noted to measure 1.5 cm in diameter.  The 12 mm balloon trocar was inserted through the fascial defect and the balloon inflated.  Was then insufflated to 15 mmHg without incident and the abdominal cavity inspected and confirmed to be without any injury.  Patient was then placed in reverse Trendelenburg and rotated to the left.  Under direct visualization, 5 mm trocars were placed in the gastrium, right midclavicular and right anterior axillary lines following infiltration with local.  Omental adhesions to the gallbladder were carefully divided with blunt dissection and cautery.  The gallbladder was noted to be extremely thickened and firm, with  evidence of acute and chronic cholecystitis.  The gallbladder fundus was retracted cephalad and the infundibulum was retracted laterally. A combination of hook electrocautery and blunt dissection was utilized to clear the peritoneum from the neck and cystic duct, circumferentially isolating the cystic artery and cystic duct and lifting the gallbladder from the cystic plate.  This dissection was tedious due to chronic inflammatory and cicatrix type changes surrounding the hepatocystic triangle.  Thin and transparent strands of tissue were taken with the cautery and dissection remained high up on the gallbladder neck.  Ultimately the critical view of safety was achieved with the cystic artery, cystic duct, and liver bed visualized between them with no other structures. The artery was clipped with a single clip proximally and distally and divided.  A posterior branch was additionally clipped with 2 clips proximally and then divided.   The cystic duct was firm, thickened and too broad to adequately be occluded by clip.  We thus proceeded to complete the gallbladder dissection off of the liver bed until the only structure connecting the patient and the gallbladder was the cystic duct.  During this dissection, the gallbladder was entered along the gallbladder body and noted to contain clear and purulent fluid consistent with hydrops and evolving gallbladder empyema.  Two 0 PDS Endoloops were placed across the gallbladder neck-cystic duct junction and then this was divided sharply.  The gallbladder was placed in a laparoscopic retrieval bag and removed through the umbilical port site. A small amount of bleeding on the liver bed was controlled with cautery and Surgicel snow. The right upper quadrant was irrigated and aspirated; the effluent was clear. Hemostasis was once again confirmed, and reinspection of the abdomen revealed no injuries. The clips were well opposed without any bile leak from the  duct or the liver bed.  The 97m trocar was removed and the hernia defect at the umbilicus was closed transversely with 3 simple interrupted 0 vicryls in the fascia under direct visualization using a PMI device. The abdomen was desufflated and all trocars removed.  The umbilical skin was sutured back down to the fascia with a 3-0 Vicryl.  The skin incisions were closed with running subcuticular 4-0 monocryl and Dermabond. The patient was awakened, extubated and transported to the recovery room in stable condition.    All counts were correct at the completion of the case.

## 2022-04-11 NOTE — Anesthesia Postprocedure Evaluation (Signed)
Anesthesia Post Note  Patient: Parker Ryan  Procedure(s) Performed: LAPAROSCOPIC CHOLECYSTECTOMY HERNIA REPAIR UMBILICAL ADULT     Patient location during evaluation: PACU Anesthesia Type: General Level of consciousness: awake and alert Pain management: pain level controlled Vital Signs Assessment: post-procedure vital signs reviewed and stable Respiratory status: spontaneous breathing, nonlabored ventilation, respiratory function stable and patient connected to nasal cannula oxygen Cardiovascular status: blood pressure returned to baseline and stable Postop Assessment: no apparent nausea or vomiting Anesthetic complications: no   No notable events documented.  Last Vitals:  Vitals:   04/11/22 1045 04/11/22 1111  BP: (!) 143/98 133/81  Pulse: 78 73  Resp: 12 16  Temp: 36.5 C 36.8 C  SpO2: 97% 96%    Last Pain:  Vitals:   04/11/22 1111  TempSrc:   PainSc: 6                  Whitman Meinhardt P Miyuki Rzasa

## 2022-04-11 NOTE — Transfer of Care (Signed)
Immediate Anesthesia Transfer of Care Note  Patient: Parker Ryan  Procedure(s) Performed: LAPAROSCOPIC CHOLECYSTECTOMY HERNIA REPAIR UMBILICAL ADULT  Patient Location: PACU  Anesthesia Type:General  Level of Consciousness: awake and patient cooperative  Airway & Oxygen Therapy: Patient Spontanous Breathing and Patient connected to face mask  Post-op Assessment: Report given to RN and Post -op Vital signs reviewed and stable  Post vital signs: Reviewed and stable  Last Vitals:  Vitals Value Taken Time  BP 165/99 04/11/22 0923  Temp    Pulse 63 04/11/22 0924  Resp 13 04/11/22 0924  SpO2 100 % 04/11/22 0924  Vitals shown include unvalidated device data.  Last Pain:  Vitals:   04/11/22 0603  TempSrc:   PainSc: 0-No pain         Complications: No notable events documented.

## 2022-04-12 ENCOUNTER — Encounter (HOSPITAL_COMMUNITY): Payer: Self-pay | Admitting: Surgery

## 2022-04-14 LAB — SURGICAL PATHOLOGY

## 2022-06-05 DIAGNOSIS — D2271 Melanocytic nevi of right lower limb, including hip: Secondary | ICD-10-CM | POA: Diagnosis not present

## 2022-06-05 DIAGNOSIS — D0321 Melanoma in situ of right ear and external auricular canal: Secondary | ICD-10-CM | POA: Diagnosis not present

## 2022-06-05 DIAGNOSIS — D2262 Melanocytic nevi of left upper limb, including shoulder: Secondary | ICD-10-CM | POA: Diagnosis not present

## 2022-06-05 DIAGNOSIS — D2261 Melanocytic nevi of right upper limb, including shoulder: Secondary | ICD-10-CM | POA: Diagnosis not present

## 2022-06-05 DIAGNOSIS — D2272 Melanocytic nevi of left lower limb, including hip: Secondary | ICD-10-CM | POA: Diagnosis not present

## 2022-07-02 DIAGNOSIS — C4321 Malignant melanoma of right ear and external auricular canal: Secondary | ICD-10-CM | POA: Diagnosis not present

## 2022-07-02 DIAGNOSIS — L905 Scar conditions and fibrosis of skin: Secondary | ICD-10-CM | POA: Diagnosis not present

## 2022-07-02 DIAGNOSIS — D0321 Melanoma in situ of right ear and external auricular canal: Secondary | ICD-10-CM | POA: Diagnosis not present

## 2022-07-02 DIAGNOSIS — L989 Disorder of the skin and subcutaneous tissue, unspecified: Secondary | ICD-10-CM | POA: Diagnosis not present

## 2022-07-25 DIAGNOSIS — N179 Acute kidney failure, unspecified: Secondary | ICD-10-CM | POA: Insufficient documentation

## 2022-07-28 ENCOUNTER — Other Ambulatory Visit: Payer: Self-pay | Admitting: Urology

## 2022-07-28 DIAGNOSIS — R1084 Generalized abdominal pain: Secondary | ICD-10-CM | POA: Diagnosis not present

## 2022-07-28 DIAGNOSIS — N201 Calculus of ureter: Secondary | ICD-10-CM | POA: Diagnosis not present

## 2022-07-30 ENCOUNTER — Encounter (HOSPITAL_BASED_OUTPATIENT_CLINIC_OR_DEPARTMENT_OTHER): Payer: Self-pay | Admitting: Urology

## 2022-07-30 NOTE — Progress Notes (Signed)
Talked with patient. Instructions given. Hx and meds reviewed. Arrvial time  16. NPO after MN. Driver secured

## 2022-08-01 ENCOUNTER — Ambulatory Visit (HOSPITAL_COMMUNITY): Payer: BC Managed Care – PPO

## 2022-08-01 ENCOUNTER — Encounter (HOSPITAL_BASED_OUTPATIENT_CLINIC_OR_DEPARTMENT_OTHER): Payer: Self-pay | Admitting: Urology

## 2022-08-01 ENCOUNTER — Encounter (HOSPITAL_BASED_OUTPATIENT_CLINIC_OR_DEPARTMENT_OTHER)
Admission: RE | Disposition: A | Discharge status: Home / Self Care | Payer: Self-pay | Source: Home / Self Care | Attending: Urology

## 2022-08-01 ENCOUNTER — Other Ambulatory Visit: Payer: Self-pay

## 2022-08-01 ENCOUNTER — Ambulatory Visit (HOSPITAL_BASED_OUTPATIENT_CLINIC_OR_DEPARTMENT_OTHER)
Admission: RE | Admit: 2022-08-01 | Discharge: 2022-08-01 | Disposition: A | Discharge status: Home / Self Care | Payer: BC Managed Care – PPO | Attending: Urology | Admitting: Urology

## 2022-08-01 DIAGNOSIS — N201 Calculus of ureter: Secondary | ICD-10-CM | POA: Diagnosis not present

## 2022-08-01 HISTORY — PX: EXTRACORPOREAL SHOCK WAVE LITHOTRIPSY: SHX1557

## 2022-08-01 SURGERY — LITHOTRIPSY, ESWL
Anesthesia: LOCAL | Laterality: Left

## 2022-08-01 MED ORDER — CIPROFLOXACIN HCL 500 MG PO TABS
ORAL_TABLET | ORAL | Status: AC
  Filled 2022-08-01: qty 1

## 2022-08-01 MED ORDER — OXYCODONE-ACETAMINOPHEN 5-325 MG PO TABS: 1.0000 | ORAL_TABLET | ORAL | 0 refills | Status: DC | PRN

## 2022-08-01 MED ORDER — DIPHENHYDRAMINE HCL 25 MG PO CAPS
ORAL_CAPSULE | ORAL | Status: AC
  Filled 2022-08-01: qty 1

## 2022-08-01 MED ORDER — CIPROFLOXACIN HCL 500 MG PO TABS
500.0000 mg | ORAL_TABLET | ORAL | Status: AC
  Administered 2022-08-01: 500 mg via ORAL

## 2022-08-01 MED ORDER — SODIUM CHLORIDE 0.9 % IV SOLN: INTRAVENOUS | Status: DC

## 2022-08-01 MED ORDER — DIAZEPAM 5 MG PO TABS
ORAL_TABLET | ORAL | Status: AC
  Filled 2022-08-01: qty 2

## 2022-08-01 MED ORDER — DIAZEPAM 5 MG PO TABS
10.0000 mg | ORAL_TABLET | ORAL | Status: AC
  Administered 2022-08-01: 10 mg via ORAL

## 2022-08-01 MED ORDER — DIPHENHYDRAMINE HCL 25 MG PO CAPS
25.0000 mg | ORAL_CAPSULE | ORAL | Status: AC
  Administered 2022-08-01: 25 mg via ORAL

## 2022-08-01 NOTE — Op Note (Signed)
[facility] Operative Note  Treating Physician: Alynna Hargrove, MD  Pre-op diagnosis: 4 mm left ureteral stone   Post-op diagnosis: Same   Procedure: LEFT ESWL  See [facility] OP note scanned into chart. Also because of the size, density, location and other factors that cannot be anticipated I feel this will likely be a staged procedure. This fact supersedes any indication in the scanned [facility] operative note to the contrary   

## 2022-08-01 NOTE — H&P (Signed)
See scanned [facility] documents for H&P.   

## 2022-08-04 ENCOUNTER — Encounter (HOSPITAL_BASED_OUTPATIENT_CLINIC_OR_DEPARTMENT_OTHER): Payer: Self-pay | Admitting: Urology

## 2022-08-14 DIAGNOSIS — N201 Calculus of ureter: Secondary | ICD-10-CM | POA: Diagnosis not present

## 2022-09-25 ENCOUNTER — Other Ambulatory Visit: Payer: Self-pay | Admitting: Family Medicine

## 2022-09-25 DIAGNOSIS — E785 Hyperlipidemia, unspecified: Secondary | ICD-10-CM

## 2022-09-25 NOTE — Telephone Encounter (Signed)
Spoke to pt, scheduled pt for 10/21/22

## 2022-09-25 NOTE — Telephone Encounter (Signed)
Patient is past due for his CPE; please call patient to set up her appt.

## 2022-10-15 ENCOUNTER — Other Ambulatory Visit: Payer: Self-pay | Admitting: Family Medicine

## 2022-10-15 DIAGNOSIS — M109 Gout, unspecified: Secondary | ICD-10-CM

## 2022-10-15 DIAGNOSIS — I10 Essential (primary) hypertension: Secondary | ICD-10-CM

## 2022-10-17 ENCOUNTER — Other Ambulatory Visit (INDEPENDENT_AMBULATORY_CARE_PROVIDER_SITE_OTHER): Payer: BC Managed Care – PPO

## 2022-10-17 DIAGNOSIS — I10 Essential (primary) hypertension: Secondary | ICD-10-CM | POA: Diagnosis not present

## 2022-10-17 DIAGNOSIS — M109 Gout, unspecified: Secondary | ICD-10-CM | POA: Diagnosis not present

## 2022-10-17 LAB — COMPREHENSIVE METABOLIC PANEL
ALT: 17 U/L (ref 0–53)
AST: 27 U/L (ref 0–37)
Albumin: 4 g/dL (ref 3.5–5.2)
Alkaline Phosphatase: 60 U/L (ref 39–117)
BUN: 23 mg/dL (ref 6–23)
CO2: 30 mEq/L (ref 19–32)
Calcium: 9.2 mg/dL (ref 8.4–10.5)
Chloride: 106 mEq/L (ref 96–112)
Creatinine, Ser: 1.41 mg/dL (ref 0.40–1.50)
GFR: 55.91 mL/min — ABNORMAL LOW (ref >60.00)
Glucose, Bld: 106 mg/dL — ABNORMAL HIGH (ref 70–99)
Potassium: 4 mEq/L (ref 3.5–5.1)
Sodium: 143 mEq/L (ref 135–145)
Total Bilirubin: 0.5 mg/dL (ref 0.2–1.2)
Total Protein: 7.2 g/dL (ref 6.0–8.3)

## 2022-10-17 LAB — LIPID PANEL
Cholesterol: 173 mg/dL (ref 0–200)
HDL: 35.6 mg/dL — ABNORMAL LOW (ref >39.00)
LDL Cholesterol: 106 mg/dL — ABNORMAL HIGH (ref 0–99)
NonHDL: 137.74
Total CHOL/HDL Ratio: 5
Triglycerides: 159 mg/dL — ABNORMAL HIGH (ref 0.0–149.0)
VLDL: 31.8 mg/dL (ref 0.0–40.0)

## 2022-10-17 LAB — CBC WITH DIFFERENTIAL/PLATELET
Basophils Absolute: 0.1 10*3/uL (ref 0.0–0.1)
Basophils Relative: 0.6 % (ref 0.0–3.0)
Eosinophils Absolute: 0.1 10*3/uL (ref 0.0–0.7)
Eosinophils Relative: 1.4 % (ref 0.0–5.0)
HCT: 46.3 % (ref 39.0–52.0)
Hemoglobin: 15.6 g/dL (ref 13.0–17.0)
Lymphocytes Relative: 25.8 % (ref 12.0–46.0)
Lymphs Abs: 2.6 10*3/uL (ref 0.7–4.0)
MCHC: 33.8 g/dL (ref 30.0–36.0)
MCV: 91.7 fl (ref 78.0–100.0)
Monocytes Absolute: 1.2 10*3/uL — ABNORMAL HIGH (ref 0.1–1.0)
Monocytes Relative: 12.1 % — ABNORMAL HIGH (ref 3.0–12.0)
Neutro Abs: 6.1 10*3/uL (ref 1.4–7.7)
Neutrophils Relative %: 60.1 % (ref 43.0–77.0)
Platelets: 304 10*3/uL (ref 150.0–400.0)
RBC: 5.05 Mil/uL (ref 4.22–5.81)
RDW: 13 % (ref 11.5–15.5)
WBC: 10.2 10*3/uL (ref 4.0–10.5)

## 2022-10-17 LAB — URIC ACID: Uric Acid, Serum: 7.4 mg/dL (ref 4.0–7.8)

## 2022-10-21 ENCOUNTER — Ambulatory Visit (INDEPENDENT_AMBULATORY_CARE_PROVIDER_SITE_OTHER): Payer: BC Managed Care – PPO | Admitting: Family Medicine

## 2022-10-21 ENCOUNTER — Encounter: Payer: Self-pay | Admitting: Family Medicine

## 2022-10-21 VITALS — BP 120/80 | HR 68 | Temp 98.1°F | Ht 74.0 in | Wt 217.0 lb

## 2022-10-21 DIAGNOSIS — L408 Other psoriasis: Secondary | ICD-10-CM

## 2022-10-21 DIAGNOSIS — G43909 Migraine, unspecified, not intractable, without status migrainosus: Secondary | ICD-10-CM

## 2022-10-21 DIAGNOSIS — M109 Gout, unspecified: Secondary | ICD-10-CM

## 2022-10-21 DIAGNOSIS — Z7189 Other specified counseling: Secondary | ICD-10-CM

## 2022-10-21 DIAGNOSIS — E785 Hyperlipidemia, unspecified: Secondary | ICD-10-CM

## 2022-10-21 DIAGNOSIS — R059 Cough, unspecified: Secondary | ICD-10-CM

## 2022-10-21 DIAGNOSIS — Z0000 Encounter for general adult medical examination without abnormal findings: Secondary | ICD-10-CM

## 2022-10-21 DIAGNOSIS — Z85528 Personal history of other malignant neoplasm of kidney: Secondary | ICD-10-CM

## 2022-10-21 DIAGNOSIS — Z23 Encounter for immunization: Secondary | ICD-10-CM | POA: Diagnosis not present

## 2022-10-21 DIAGNOSIS — I10 Essential (primary) hypertension: Secondary | ICD-10-CM

## 2022-10-21 MED ORDER — LISINOPRIL 20 MG PO TABS: 20.0000 mg | ORAL_TABLET | Freq: Every day | ORAL | 3 refills | Status: DC

## 2022-10-21 MED ORDER — NIACIN ER (ANTIHYPERLIPIDEMIC) 1000 MG PO TBCR: EXTENDED_RELEASE_TABLET | ORAL | 3 refills | Status: DC

## 2022-10-21 NOTE — Progress Notes (Unsigned)
CPE- See plan.  Routine anticipatory guidance given to patient.  See health maintenance.  The possibility exists that previously documented standard health maintenance information may have been brought forward from a previous encounter into this note.  If needed, that same information has been updated to reflect the current situation based on today's encounter.    Tetanus 2017 Flu shot 2024 Shingles d/w pt and PNA not due.  covid vaccine prev done.   Colonoscopy 2019 Prostate cancer screening and PSA options (with potential risks and benefits of testing vs not testing) were discussed along with recent recs/guidelines.  He declined testing PSA at this point.  Living will.  Wife designated if patient were incapacitated.   Diet and exercise d/w pt.    Hypertension:    Using medication without problems or lightheadedness: yes Chest pain with exertion:no Edema:no Short of breath:no Labs d/w pt.   He had higher BP readings at home.   Cr stable.   HA, migraine with minimal pain but stereotypical vision changes. Has visual aura, episodic, tend to happen in sequence, ie consecutive days.  Several per month.  Tylenol/exceedrin helps, limits duration.  Sx lasts about 45 min o/w.  Sensitive to sound and light afterward.    Elevated Cholesterol: Using medications without problems:yes Muscle aches: no Diet compliance: yes Exercise: yes Cough.  He was off fenofibrate and the cough improved.  D/w pt about options. ACE stoppage didn't help.   H/o episodic seasonal allergies.    Rare use of colchicine.  It helps.  Used prn.  Usually at R 1st MTP.    Off flomax.  D/w pt.    Psoriasis per derm.    GI sx improved after gall bladder surgery.    PMH and SH reviewed  Meds, vitals, and allergies reviewed.   ROS: Per HPI.  Unless specifically indicated otherwise in HPI, the patient denies:  General: fever. Eyes: acute vision changes ENT: sore throat Cardiovascular: chest pain Respiratory:  SOB GI: vomiting GU: dysuria Musculoskeletal: acute back pain Derm: acute rash Neuro: acute motor dysfunction Psych: worsening mood Endocrine: polydipsia Heme: bleeding Allergy: hayfever  GEN: nad, alert and oriented HEENT: ncat NECK: supple w/o LA CV: rrr. PULM: ctab, no inc wob ABD: soft, +bs EXT: no edema SKIN: no acute rash

## 2022-10-21 NOTE — Patient Instructions (Addendum)
Stop fenofibrate for 2 weeks and let me know about the cough.   Keep charting your migraines.  Take care.  Glad to see you. GI should call you later this year.   Flu shot today.

## 2022-10-22 NOTE — Assessment & Plan Note (Signed)
Living will.  Wife designated if patient were incapacitated.   ?

## 2022-10-22 NOTE — Assessment & Plan Note (Signed)
Noted Cr, d/w pt.

## 2022-10-22 NOTE — Assessment & Plan Note (Signed)
Tetanus 2017 Flu shot 2024 Shingles d/w pt and PNA not due.  covid vaccine prev done.   Colonoscopy 2019 Prostate cancer screening and PSA options (with potential risks and benefits of testing vs not testing) were discussed along with recent recs/guidelines.  He declined testing PSA at this point.  Living will.  Wife designated if patient were incapacitated.   Diet and exercise d/w pt.

## 2022-10-22 NOTE — Assessment & Plan Note (Signed)
Psoriasis per derm.

## 2022-10-22 NOTE — Assessment & Plan Note (Signed)
Labs d/w pt. He was off fenofibrate and the cough improved.  D/w pt about options. ACE stoppage didn't help.  He will stop fenofibrate for 2 weeks and let me know about the cough at that point.

## 2022-10-22 NOTE — Assessment & Plan Note (Signed)
Cough.  He was off fenofibrate and the cough improved.  D/w pt about options. ACE stoppage didn't help.  He will stop fenofibrate for 2 weeks and let me know about the cough at that point.

## 2022-10-22 NOTE — Assessment & Plan Note (Signed)
He had higher BP readings at home.  He can bring in his cuff next time.  Continue lisinopril for now.

## 2022-10-22 NOTE — Assessment & Plan Note (Signed)
I asked him to chart his migraines.  We talked about preventive meds, with beta blockers, gabapentin, TCA, etc.  He can update me if more frequent or bothersome.  Will address cough first.

## 2022-10-22 NOTE — Assessment & Plan Note (Signed)
Rare use of colchicine.  It helps.  Used prn.  Usually at R 1st MTP.  Labs d/w pt.  Continue as is with prn use.

## 2022-10-24 ENCOUNTER — Other Ambulatory Visit (HOSPITAL_COMMUNITY): Payer: Self-pay

## 2022-10-30 ENCOUNTER — Telehealth: Payer: Self-pay

## 2022-10-30 ENCOUNTER — Other Ambulatory Visit (HOSPITAL_COMMUNITY): Payer: Self-pay

## 2022-10-30 NOTE — Telephone Encounter (Signed)
Pharmacy Patient Advocate Encounter   Received notification from CoverMyMeds that prior authorization for Niacin ER 1000mg  is required/requested.   Insurance verification completed.   The patient is insured through  [employer]   .   Per test claim: PA required; PA submitted to [employer] via CoverMyMeds Key/confirmation #/EOC [id number] Status is pending

## 2022-11-03 NOTE — Telephone Encounter (Signed)
Pharmacy Patient Advocate Encounter  Received notification from [facility] that Prior Authorization for Niacin ER (Antihyperlipidemic) 1000MG  er tablets has been DENIED.  Full denial letter will be uploaded to the media tab. See denial reason below.  We denied your request because we did not see what we need to approve the drug you asked for, (niacin extended-release tablet). We may be able to approve this drug in certain situations (if you have had a trial  of two preferred statins [atorvastatin, fluvastatin, fluvastatin extended relief (ER), lovastatin, pravastatin, rosuvastatin, simvastatin] and did not achieve low-density lipoprotein cholesterol goal; if you have an inability to tolerate at least two statins, with at least one started at the lowest starting daily dose, as demonstrated by adverse effects associated with statin therapy that resolve or improve with dose reduction or discontinuation; if you have had statin associated rhabdomyolysis or immune-mediated necrotizing myopathy [IMNM] after a trial of one statin; or if you are requesting niacin to treat high triglyceride levels [triglycerides greater than or equal to 500 milligram per deciliter (mg/dL)]). We do not see that this applies to you. If this applies to you, we may need more information (if you have tried and cannot take certain other drugs; if your kidneys are not working well; if you are nursing a child).   PA #/Case ID/Reference #: [id number]  Please be advised we currently do not have a Pharmacist to review denials, therefore you will need to process appeals accordingly as needed. Thanks for your support at this time. Contact for appeals are as follows: Phone: [phone], Fax: [phone]

## 2022-11-04 NOTE — Telephone Encounter (Signed)
He has a h/o TG >500.  Level of 533 on 10/28/07.  Please appeal.  Thanks.

## 2022-11-07 NOTE — Telephone Encounter (Signed)
Tried to do appeal but per patients chart atorvastatin is the only other previous statin listed he's tried. Patient has to try two others before this will be approved. Please advise.

## 2022-11-07 NOTE — Telephone Encounter (Signed)
"  or if you are requesting niacin to treat high triglyceride levels [triglycerides greater than or equal to 500 milligram per deciliter (mg/dL)])"  He has a h/o TG >161 so he should meet that exemption. Level of 533 on 10/28/07. Please appeal. Thanks.

## 2022-11-13 NOTE — Telephone Encounter (Signed)
Phone call to [employer], spoke with [employer]  She reported that they do not take telephone appeals, would need to fax over information. Gave different fax number than the PA Team.  Created letter with necessary information, printed lab from 10/28/2007 as Dr. Para March had requested.  Faxed info to [phone]. Received confirmation.

## 2022-11-17 ENCOUNTER — Encounter: Payer: Self-pay | Admitting: Family Medicine

## 2023-01-08 ENCOUNTER — Encounter: Payer: Self-pay | Admitting: Gastroenterology

## 2023-01-12 DIAGNOSIS — Z86006 Personal history of melanoma in-situ: Secondary | ICD-10-CM | POA: Diagnosis not present

## 2023-01-12 DIAGNOSIS — Z08 Encounter for follow-up examination after completed treatment for malignant neoplasm: Secondary | ICD-10-CM | POA: Diagnosis not present

## 2023-01-12 DIAGNOSIS — C4321 Malignant melanoma of right ear and external auricular canal: Secondary | ICD-10-CM | POA: Diagnosis not present

## 2023-01-12 DIAGNOSIS — L905 Scar conditions and fibrosis of skin: Secondary | ICD-10-CM | POA: Diagnosis not present

## 2023-02-06 DIAGNOSIS — N201 Calculus of ureter: Secondary | ICD-10-CM | POA: Diagnosis not present

## 2023-02-10 DIAGNOSIS — Z85528 Personal history of other malignant neoplasm of kidney: Secondary | ICD-10-CM | POA: Diagnosis not present

## 2023-02-13 DIAGNOSIS — Z85528 Personal history of other malignant neoplasm of kidney: Secondary | ICD-10-CM | POA: Diagnosis not present

## 2023-03-25 ENCOUNTER — Other Ambulatory Visit: Payer: Self-pay

## 2023-03-25 ENCOUNTER — Emergency Department (HOSPITAL_BASED_OUTPATIENT_CLINIC_OR_DEPARTMENT_OTHER)
Admission: EM | Admit: 2023-03-25 | Discharge: 2023-03-25 | Disposition: A | Discharge status: Home / Self Care | Payer: BC Managed Care – PPO | Attending: Emergency Medicine | Admitting: Emergency Medicine

## 2023-03-25 ENCOUNTER — Other Ambulatory Visit (HOSPITAL_BASED_OUTPATIENT_CLINIC_OR_DEPARTMENT_OTHER): Payer: Self-pay

## 2023-03-25 ENCOUNTER — Emergency Department (HOSPITAL_BASED_OUTPATIENT_CLINIC_OR_DEPARTMENT_OTHER): Payer: BC Managed Care – PPO | Admitting: Radiology

## 2023-03-25 ENCOUNTER — Encounter (HOSPITAL_BASED_OUTPATIENT_CLINIC_OR_DEPARTMENT_OTHER): Payer: Self-pay | Admitting: Emergency Medicine

## 2023-03-25 DIAGNOSIS — J09X2 Influenza due to identified novel influenza A virus with other respiratory manifestations: Secondary | ICD-10-CM | POA: Diagnosis not present

## 2023-03-25 DIAGNOSIS — Z20822 Contact with and (suspected) exposure to covid-19: Secondary | ICD-10-CM | POA: Insufficient documentation

## 2023-03-25 DIAGNOSIS — I10 Essential (primary) hypertension: Secondary | ICD-10-CM | POA: Diagnosis not present

## 2023-03-25 DIAGNOSIS — Z79899 Other long term (current) drug therapy: Secondary | ICD-10-CM | POA: Diagnosis not present

## 2023-03-25 DIAGNOSIS — R059 Cough, unspecified: Secondary | ICD-10-CM | POA: Diagnosis not present

## 2023-03-25 DIAGNOSIS — J101 Influenza due to other identified influenza virus with other respiratory manifestations: Secondary | ICD-10-CM

## 2023-03-25 LAB — CBC WITH DIFFERENTIAL/PLATELET
Abs Immature Granulocytes: 0.03 10*3/uL (ref 0.00–0.07)
Basophils Absolute: 0 10*3/uL (ref 0.0–0.1)
Basophils Relative: 0 %
Eosinophils Absolute: 0.1 10*3/uL (ref 0.0–0.5)
Eosinophils Relative: 1 %
HCT: 44.5 % (ref 39.0–52.0)
Hemoglobin: 15.7 g/dL (ref 13.0–17.0)
Immature Granulocytes: 0 %
Lymphocytes Relative: 16 %
Lymphs Abs: 1.5 10*3/uL (ref 0.7–4.0)
MCH: 31.3 pg (ref 26.0–34.0)
MCHC: 35.3 g/dL (ref 30.0–36.0)
MCV: 88.8 fL (ref 80.0–100.0)
Monocytes Absolute: 1.3 10*3/uL — ABNORMAL HIGH (ref 0.1–1.0)
Monocytes Relative: 14 %
Neutro Abs: 6.4 10*3/uL (ref 1.7–7.7)
Neutrophils Relative %: 69 %
Platelets: 189 10*3/uL (ref 150–400)
RBC: 5.01 MIL/uL (ref 4.22–5.81)
RDW: 13 % (ref 11.5–15.5)
WBC: 9.3 10*3/uL (ref 4.0–10.5)
nRBC: 0 % (ref 0.0–0.2)

## 2023-03-25 LAB — COMPREHENSIVE METABOLIC PANEL
ALT: 17 U/L (ref 0–44)
AST: 23 U/L (ref 15–41)
Albumin: 3.7 g/dL (ref 3.5–5.0)
Alkaline Phosphatase: 77 U/L (ref 38–126)
Anion gap: 8 (ref 5–15)
BUN: 17 mg/dL (ref 6–20)
CO2: 26 mmol/L (ref 22–32)
Calcium: 8.8 mg/dL — ABNORMAL LOW (ref 8.9–10.3)
Chloride: 104 mmol/L (ref 98–111)
Creatinine, Ser: 1.14 mg/dL (ref 0.61–1.24)
GFR, Estimated: >60 mL/min (ref >60)
Glucose, Bld: 114 mg/dL — ABNORMAL HIGH (ref 70–99)
Potassium: 4.2 mmol/L (ref 3.5–5.1)
Sodium: 138 mmol/L (ref 135–145)
Total Bilirubin: 0.6 mg/dL (ref 0.0–1.2)
Total Protein: 7 g/dL (ref 6.5–8.1)

## 2023-03-25 LAB — RESP PANEL BY RT-PCR (RSV, FLU A&B, COVID)  RVPGX2
Influenza A by PCR: POSITIVE — AB
Influenza B by PCR: NEGATIVE
Resp Syncytial Virus by PCR: NEGATIVE
SARS Coronavirus 2 by RT PCR: NEGATIVE

## 2023-03-25 LAB — MAGNESIUM: Magnesium: 1.9 mg/dL (ref 1.7–2.4)

## 2023-03-25 MED ORDER — KETOROLAC TROMETHAMINE 15 MG/ML IJ SOLN
15.0000 mg | Freq: Once | INTRAMUSCULAR | Status: AC
  Administered 2023-03-25: 15 mg via INTRAVENOUS
  Filled 2023-03-25: qty 1

## 2023-03-25 MED ORDER — KETOROLAC TROMETHAMINE 60 MG/2ML IM SOLN
30.0000 mg | Freq: Once | INTRAMUSCULAR | Status: DC
  Filled 2023-03-25: qty 2

## 2023-03-25 MED ORDER — BENZONATATE 100 MG PO CAPS
100.0000 mg | ORAL_CAPSULE | Freq: Three times a day (TID) | ORAL | 0 refills | Status: DC
  Filled 2023-03-25: qty 21, 7d supply, fill #0

## 2023-03-25 NOTE — ED Triage Notes (Signed)
Pt endorses "flu like symptoms", cough, chills, fever, sore throat and diarrhea x 5 days. Pt reports "wheezing"

## 2023-03-25 NOTE — ED Notes (Signed)
RT ambulated the Pt on RA. SATS 93-94%, HR 100

## 2023-03-25 NOTE — ED Provider Notes (Signed)
[place] EMERGENCY DEPARTMENT AT [facility] Provider Note   CSN: [id number] Arrival date & time: 03/25/23  7829     History  Chief Complaint  Patient presents with   Cough    Parker Ryan is a 58 y.o. male.   Cough Associated symptoms: chills, myalgias and sore throat      58 year old male with medical history significant for HLD, HTN, psoriasis, H. pylori infection, gout who presents to the emergency department with flulike symptoms.  He endorses a cough, chills, sore throat, fever, generalized bodyaches as well as watery diarrhea for the last 5 days.  He feels like his symptoms have worsened over the last day prompting his presentation to the emergency department.  He denies any abdominal pain.  He denies any vomiting.  He is still tolerating oral intake.  He denies any sick contacts.  Home Medications Prior to Admission medications   Medication Sig Start Date End Date Taking? Authorizing Provider  benzonatate (TESSALON) 100 MG capsule Take 1 capsule (100 mg total) by mouth every 8 (eight) hours. 03/25/23  Yes Ernie Avena, MD  acetaminophen (TYLENOL) 325 MG tablet Take 500 mg by mouth every 6 (six) hours as needed for moderate pain.    [provider]  clobetasol (TEMOVATE) 0.05 % external solution Apply 1-2 times daily as needed. 07/05/21   Joaquim Nam, MD  colchicine 0.6 MG tablet Take 1 tablet (0.6 mg total) by mouth daily as needed. 07/05/21   Joaquim Nam, MD  fenofibrate (TRICOR) 145 MG tablet TAKE 1 TABLET(145 MG) BY MOUTH DAILY 09/25/22   Joaquim Nam, MD  ibuprofen (ADVIL) 200 MG tablet Take 400 mg by mouth every 6 (six) hours as needed for moderate pain.    [provider]  lisinopril (ZESTRIL) 20 MG tablet Take 1 tablet (20 mg total) by mouth daily. 10/21/22   Joaquim Nam, MD  niacin (NIASPAN) 1000 MG CR tablet TAKE 1 TABLET BY MOUTH AT BEDTIME. 10/21/22   Joaquim Nam, MD  omeprazole (PRILOSEC) 40 MG capsule Take 1  capsule (40 mg total) by mouth daily. 07/05/21   Joaquim Nam, MD  VTAMA 1 % CREA Apply 1 Application topically daily as needed (psoriasis). 11/29/21   [provider]      Allergies    Patient has no known allergies.    Review of Systems   Review of Systems  Constitutional:  Positive for chills.  HENT:  Positive for sore throat.   Respiratory:  Positive for cough.   Gastrointestinal:  Positive for diarrhea.  Musculoskeletal:  Positive for myalgias.  All other systems reviewed and are negative.   Physical Exam Updated Vital Signs BP (!) 144/96   Pulse 76   Temp 98.8 F (37.1 C) (Oral)   Resp 18   SpO2 99%  Physical Exam Vitals and nursing note reviewed.  Constitutional:      General: He is not in acute distress.    Appearance: He is well-developed.  HENT:     Head: Normocephalic and atraumatic.     Mouth/Throat:     Mouth: Mucous membranes are moist.     Comments: Mucous membranes appear moist Eyes:     Conjunctiva/sclera: Conjunctivae normal.  Cardiovascular:     Rate and Rhythm: Normal rate and regular rhythm.  Pulmonary:     Effort: Pulmonary effort is normal. No respiratory distress.     Breath sounds: Normal breath sounds.  Abdominal:     General: There  is no distension.     Palpations: Abdomen is soft.     Tenderness: There is no abdominal tenderness. There is no guarding.  Musculoskeletal:        General: No swelling.     Cervical back: Neck supple.  Skin:    General: Skin is warm and dry.     Capillary Refill: Capillary refill takes less than 2 seconds.  Neurological:     General: No focal deficit present.     Mental Status: He is alert. Mental status is at baseline.  Psychiatric:        Mood and Affect: Mood normal.     ED Results / Procedures / Treatments   Labs (all labs ordered are listed, but only abnormal results are displayed) Labs Reviewed  RESP PANEL BY RT-PCR (RSV, FLU A&B, COVID)  RVPGX2 - Abnormal; Notable for the  following components:      Result Value   Influenza A by PCR POSITIVE (*)    All other components within normal limits  CBC WITH DIFFERENTIAL/PLATELET - Abnormal; Notable for the following components:   Monocytes Absolute 1.3 (*)    All other components within normal limits  COMPREHENSIVE METABOLIC PANEL - Abnormal; Notable for the following components:   Glucose, Bld 114 (*)    Calcium 8.8 (*)    All other components within normal limits  MAGNESIUM    EKG None  Radiology DG Chest 2 View Result Date: 03/25/2023 CLINICAL DATA:  Cough for 5 days. EXAM: CHEST - 2 VIEW COMPARISON:  02/06/2021. FINDINGS: Bilateral lung fields are clear. Bilateral costophrenic angles are clear. Normal cardio-mediastinal silhouette. No acute osseous abnormalities. The soft tissues are within normal limits. IMPRESSION: No active cardiopulmonary disease. Electronically Signed   By: Jules Schick M.D.   On: 03/25/2023 09:06    Procedures Procedures    Medications Ordered in ED Medications  ketorolac (TORADOL) 15 MG/ML injection 15 mg (15 mg Intravenous Given 03/25/23 0934)    ED Course/ Medical Decision Making/ A&P Clinical Course as of 03/25/23 0936  Wed Mar 25, 2023  0910 Influenza A By PCR(!): POSITIVE [JL]    Clinical Course User Index [JL] Ernie Avena, MD                                 Medical Decision Making Amount and/or Complexity of Data Reviewed Labs: ordered. Decision-making details documented in ED Course. Radiology: ordered.  Risk Prescription drug management.     58 year old male with medical history significant for HLD, HTN, psoriasis, H. pylori infection, gout who presents to the emergency department with flulike symptoms.  He endorses a cough, chills, sore throat, fever, generalized bodyaches as well as watery diarrhea for the last 5 days.  He feels like his symptoms have worsened over the last day prompting his presentation to the emergency department.  He denies any  abdominal pain.  He denies any vomiting.  He is still tolerating oral intake.  He denies any sick contacts.  On arrival, the patient was afebrile, not tachycardic or tachypneic, BP 144/96, saturating 99% on room air.  Sinus rhythm noted on cardiac telemetry.  Physical exam revealed an overall well-appearing male in no distress, lungs clear on auscultation, heart rate normal, mucous membranes moist with an abdomen that is soft, nontender, nondistended with no rebound or guarding.  Patient presenting with a flulike illness.  Given the fact that he has had 5 days  also of watery diarrhea and persistent and worsening respiratory symptoms, will obtain a chest x-ray as well as screening labs to evaluate for potential electrolyte abnormality.  The patient's abdomen is nontender on exam, low concern for acute intra-abdominal abnormality at this time.  Labs: PCR testing positive for influenza.  CBC without a leukocytosis or anemia, magnesium normal, CMP without significant electrolyte abnormality, normal renal function.  CXR: Negative for PNA or PTX  The patient was administered Toradol for muscle aches.  He was ambulated in the emergency department with no significant desaturations on room air.  Advised continued symptomatic management with Tylenol and ibuprofen at home, return for any severe worsening symptoms.  Stable for discharge.   Final Clinical Impression(s) / ED Diagnoses Final diagnoses:  Influenza A    Rx / DC Orders ED Discharge Orders          Ordered    benzonatate (TESSALON) 100 MG capsule  Every 8 hours        03/25/23 0927              Ernie Avena, MD 03/25/23 [phone]

## 2023-03-25 NOTE — Discharge Instructions (Addendum)
You tested positive for influenza.  You are outside the window for Tamiflu.  Your chest x-ray showed no evidence of pneumonia.  Your electrolytes were normal.  Recommend continued outpatient management with oral fluid resuscitation, Tylenol and ibuprofen throughout the day for fever control and muscle aches.

## 2023-03-25 NOTE — ED Notes (Addendum)
error

## 2023-03-25 NOTE — ED Notes (Signed)
Patient transported to X-ray

## 2023-03-25 NOTE — ED Notes (Signed)
Triage delay, pt in restroom

## 2023-05-05 ENCOUNTER — Other Ambulatory Visit (HOSPITAL_COMMUNITY): Payer: Self-pay

## 2023-05-05 ENCOUNTER — Telehealth: Payer: Self-pay

## 2023-05-05 NOTE — Telephone Encounter (Signed)
 Pharmacy Patient Advocate Encounter   Received notification from [employer] that prior authorization for Niacin ER (Antihyperlipidemic) 1000MG  er tablets is required/requested.   Insurance verification completed.   The patient is insured through [employer] .   Per test claim: PA required; PA submitted to above mentioned insurance via CoverMyMeds Key/confirmation #/EOC [id number] Status is pending

## 2023-05-14 NOTE — Telephone Encounter (Signed)
 Pharmacy Patient Advocate Encounter  Received notification from [facility] that Prior Authorization for Niacin ER (Antihyperlipidemic) 1000MG  er tablets has been DENIED.  Full denial letter will be uploaded to the media tab. See denial reason below.    PA #/Case ID/Reference #: [id number]

## 2023-05-15 NOTE — Telephone Encounter (Signed)
 Please resubmit this.  He has a triglyceride level above 500 from 10/28/2007.  Thanks.

## 2023-05-15 NOTE — Telephone Encounter (Signed)
 See notes below

## 2023-05-15 NOTE — Telephone Encounter (Signed)
 Per denial letter, pt must first try and fail two statins for a prior authorization.

## 2023-05-15 NOTE — Telephone Encounter (Signed)
 Dr. Para March request that a prior authorization be resubmitted because of patients elevated triglycerides

## 2023-05-17 NOTE — Telephone Encounter (Signed)
 Please notify patient.  See if he wants to pay out-of-pocket or if he wants to try a statin instead.  Please let me know.  Thanks.

## 2023-05-18 NOTE — Telephone Encounter (Signed)
 Patient is wondering if he can buy the otc version of the niacin and take it that way. Please advise

## 2023-05-19 MED ORDER — NIACIN ER 250 MG PO CPCR: 250.0000 mg | ORAL_CAPSULE | Freq: Every day | ORAL | Status: DC

## 2023-05-19 NOTE — Telephone Encounter (Signed)
 That is one other option.  He could try OTC niacin 250mg  at night.  If tolerated w/o skin flushing, could increase by 250mg  per week up to 1000mg  at night.   Please let me know how that goes.  Thanks.

## 2023-05-19 NOTE — Telephone Encounter (Signed)
 Information and instructions relayed to patient

## 2023-05-19 NOTE — Addendum Note (Signed)
 Addended by: Joaquim Nam on: 05/19/2023 02:11 PM   Modules accepted: Orders

## 2023-05-28 ENCOUNTER — Other Ambulatory Visit: Payer: Self-pay | Admitting: Family Medicine

## 2023-05-28 DIAGNOSIS — E785 Hyperlipidemia, unspecified: Secondary | ICD-10-CM

## 2023-05-28 NOTE — Telephone Encounter (Signed)
 LOV:10/21/22 NOV: NOTHING SCHEDULED  LAST REFILL: fenofibrate (TRICOR) 145 MG tablet 90 tablets 0 refill

## 2023-05-29 NOTE — Telephone Encounter (Signed)
 Please see prev phone note.  I thought this was prev denied by his insurance.  Thanks.

## 2023-05-29 NOTE — Telephone Encounter (Signed)
 It was denied for the niacin

## 2023-05-29 NOTE — Telephone Encounter (Signed)
 I apologize.  Thanks.  Rx sent.

## 2023-06-22 DIAGNOSIS — L570 Actinic keratosis: Secondary | ICD-10-CM | POA: Diagnosis not present

## 2023-06-22 DIAGNOSIS — D2239 Melanocytic nevi of other parts of face: Secondary | ICD-10-CM | POA: Diagnosis not present

## 2023-06-22 DIAGNOSIS — D485 Neoplasm of uncertain behavior of skin: Secondary | ICD-10-CM | POA: Diagnosis not present

## 2023-06-22 DIAGNOSIS — Z85820 Personal history of malignant melanoma of skin: Secondary | ICD-10-CM | POA: Diagnosis not present

## 2023-06-22 DIAGNOSIS — D225 Melanocytic nevi of trunk: Secondary | ICD-10-CM | POA: Diagnosis not present

## 2023-06-22 DIAGNOSIS — D224 Melanocytic nevi of scalp and neck: Secondary | ICD-10-CM | POA: Diagnosis not present

## 2023-06-22 DIAGNOSIS — D2271 Melanocytic nevi of right lower limb, including hip: Secondary | ICD-10-CM | POA: Diagnosis not present

## 2023-06-22 DIAGNOSIS — L400 Psoriasis vulgaris: Secondary | ICD-10-CM | POA: Diagnosis not present

## 2023-07-16 DIAGNOSIS — D485 Neoplasm of uncertain behavior of skin: Secondary | ICD-10-CM | POA: Diagnosis not present

## 2023-07-16 DIAGNOSIS — L988 Other specified disorders of the skin and subcutaneous tissue: Secondary | ICD-10-CM | POA: Diagnosis not present

## 2023-07-30 DIAGNOSIS — H53143 Visual discomfort, bilateral: Secondary | ICD-10-CM | POA: Diagnosis not present

## 2023-07-30 DIAGNOSIS — R519 Headache, unspecified: Secondary | ICD-10-CM | POA: Diagnosis not present

## 2023-09-01 ENCOUNTER — Ambulatory Visit (INDEPENDENT_AMBULATORY_CARE_PROVIDER_SITE_OTHER)
Admission: RE | Admit: 2023-09-01 | Discharge: 2023-09-01 | Disposition: A | Discharge status: Home / Self Care | Source: Ambulatory Visit | Attending: Family Medicine | Admitting: Family Medicine

## 2023-09-01 ENCOUNTER — Ambulatory Visit (INDEPENDENT_AMBULATORY_CARE_PROVIDER_SITE_OTHER): Admitting: Family Medicine

## 2023-09-01 ENCOUNTER — Encounter: Payer: Self-pay | Admitting: Family Medicine

## 2023-09-01 ENCOUNTER — Ambulatory Visit: Payer: Self-pay

## 2023-09-01 VITALS — BP 150/90 | HR 61 | Temp 98.4°F | Ht 74.0 in | Wt 219.5 lb

## 2023-09-01 DIAGNOSIS — L409 Psoriasis, unspecified: Secondary | ICD-10-CM | POA: Diagnosis not present

## 2023-09-01 DIAGNOSIS — M25571 Pain in right ankle and joints of right foot: Secondary | ICD-10-CM

## 2023-09-01 DIAGNOSIS — M7989 Other specified soft tissue disorders: Secondary | ICD-10-CM | POA: Diagnosis not present

## 2023-09-01 DIAGNOSIS — M25579 Pain in unspecified ankle and joints of unspecified foot: Secondary | ICD-10-CM | POA: Diagnosis not present

## 2023-09-01 DIAGNOSIS — M19071 Primary osteoarthritis, right ankle and foot: Secondary | ICD-10-CM | POA: Diagnosis not present

## 2023-09-01 MED ORDER — PREDNISONE 20 MG PO TABS: ORAL_TABLET | ORAL | 0 refills | Status: DC

## 2023-09-01 NOTE — Telephone Encounter (Signed)
 See OV  note

## 2023-09-01 NOTE — Telephone Encounter (Signed)
 FYI Only or Action Required?: FYI only for provider.  Patient was last seen in primary care on 10/21/2022 by Cleatus Arlyss RAMAN, MD.  Called Nurse Triage reporting Ankle Pain.  Symptoms began several days ago.  Interventions attempted: OTC medications: Tylenol , ibuprofen , muscle rub, Rest, hydration, or home remedies, and Ice/heat application.  Symptoms are: gradually worsening.  Triage Disposition: See HCP Within 4 Hours (Or PCP Triage)  Patient/caregiver understands and will follow disposition?: Yes  Copied from CRM [id number]. Topic: Clinical - Red Word Triage >> Sep 01, 2023  9:07 AM Powell HERO wrote: Red Word that prompted transfer to Nurse Triage: Sudden onset of severe ankle pain, having trouble putting weight on it. The last couple of days he's been taking OTC meds and and wrapping it and nothing seems to ease the pain. Reason for Disposition  [1] SEVERE pain (e.g., excruciating, unable to walk) AND [2] not improved after 2 hours of pain medicine  Answer Assessment - Initial Assessment Questions 1. ONSET: When did the pain start?      4 days ago      2. PAIN: How bad is the pain?    (Scale 1-10; or mild, moderate, severe)  - MILD (1-3): doesn't interfere with normal activities.   - MODERATE (4-7): interferes with normal activities (e.g., work or school) or awakens from sleep, limping.   - SEVERE (8-10): excruciating pain, unable to do any normal activities, unable to walk.      Severe 3. WORK OR EXERCISE: Has there been any recent work or exercise that involved this part of the body?      no 4. CAUSE: What do you think is causing the ankle pain?     Unsure-no injury 7. OTHER SYMPTOMS: Do you have any other symptoms? (e.g., calf pain, rash, fever, swelling)     Denies. Mild swelling.   Ice, ibuprofen , muscle rub.  Protocols used: Ankle Pain-A-AH

## 2023-09-01 NOTE — Patient Instructions (Signed)
 Xray on the way out.  Prednisone  with food.  Stop aleve/ibuprofen .  Update me as needed.  Refer to rheumatology.  Let me know if your BP stays elevated.  Take care.  Glad to see you.

## 2023-09-01 NOTE — Progress Notes (Unsigned)
 R ankle pain. Started with discomfort about 4-5 days ago.  Sore with walking.    Prev with R knee pain, last week.  That likely affected his gait.    Then more pain in the ankle yesterday and today.  No trauma.  Iced, ibuprofen , tylenol  w/o sig relief.    H/o psoriasis.  He has some B elbow pain w/o trauma.  He had some R thumb pain recently.    He doesn't have h/o gout in the ankle.  Prev with MTP pain.    Meds, vitals, and allergies reviewed.   ROS: Per HPI unless specifically indicated in ROS section   Normal elbow ROM B R ankle puffy and ttp medial malleolus.   Achilles not ttp.

## 2023-09-02 DIAGNOSIS — M25579 Pain in unspecified ankle and joints of unspecified foot: Secondary | ICD-10-CM | POA: Insufficient documentation

## 2023-09-02 NOTE — Assessment & Plan Note (Signed)
 Xray on the way out.  Prednisone  with food.  Stop aleve/ibuprofen .  Steroid cautions discussed with patient. Update me as needed.  Refer to rheumatology.  Discussed concern for psoriatic arthritis.  I will rheumatology input. He can let me know if his BP stays elevated.

## 2023-09-06 ENCOUNTER — Ambulatory Visit: Payer: Self-pay | Admitting: Family Medicine

## 2023-10-23 ENCOUNTER — Telehealth: Payer: Self-pay

## 2023-10-23 NOTE — Telephone Encounter (Signed)
 Copied from CRM [id number]. Topic: Medical Record Request - Records Request >> Oct 23, 2023 11:43 AM Chasity T wrote: Reason for CRM: Patient is requesting all attending physician statements. Please contact patient to confirm papers.

## 2023-10-23 NOTE — Telephone Encounter (Signed)
 Called patient advised we have not received a form to complete on his behalf gave fax number so form can be faxed in or patient by bring in person to be completed by provider.

## 2023-12-16 ENCOUNTER — Other Ambulatory Visit: Payer: Self-pay | Admitting: Family Medicine

## 2023-12-17 NOTE — Telephone Encounter (Signed)
 Pt is past due for annual exam and labs

## 2024-01-07 ENCOUNTER — Other Ambulatory Visit: Payer: Self-pay | Admitting: Family Medicine

## 2024-01-07 DIAGNOSIS — M109 Gout, unspecified: Secondary | ICD-10-CM

## 2024-01-07 DIAGNOSIS — I10 Essential (primary) hypertension: Secondary | ICD-10-CM

## 2024-01-07 DIAGNOSIS — E785 Hyperlipidemia, unspecified: Secondary | ICD-10-CM

## 2024-01-18 ENCOUNTER — Ambulatory Visit: Payer: Self-pay | Admitting: Family Medicine

## 2024-01-18 ENCOUNTER — Other Ambulatory Visit (INDEPENDENT_AMBULATORY_CARE_PROVIDER_SITE_OTHER): Payer: Self-pay

## 2024-01-18 DIAGNOSIS — I10 Essential (primary) hypertension: Secondary | ICD-10-CM | POA: Diagnosis not present

## 2024-01-18 DIAGNOSIS — E785 Hyperlipidemia, unspecified: Secondary | ICD-10-CM

## 2024-01-18 DIAGNOSIS — M109 Gout, unspecified: Secondary | ICD-10-CM | POA: Diagnosis not present

## 2024-01-18 LAB — LIPID PANEL
Cholesterol: 187 mg/dL (ref 0–200)
HDL: 35.9 mg/dL — ABNORMAL LOW (ref >39.00)
LDL Cholesterol: 98 mg/dL (ref 0–99)
NonHDL: 151.02
Total CHOL/HDL Ratio: 5
Triglycerides: 267 mg/dL — ABNORMAL HIGH (ref 0.0–149.0)
VLDL: 53.4 mg/dL — ABNORMAL HIGH (ref 0.0–40.0)

## 2024-01-18 LAB — CBC WITH DIFFERENTIAL/PLATELET
Basophils Absolute: 0.1 K/uL (ref 0.0–0.1)
Basophils Relative: 0.8 % (ref 0.0–3.0)
Eosinophils Absolute: 0.2 K/uL (ref 0.0–0.7)
Eosinophils Relative: 2.3 % (ref 0.0–5.0)
HCT: 45.8 % (ref 39.0–52.0)
Hemoglobin: 15.7 g/dL (ref 13.0–17.0)
Lymphocytes Relative: 30.7 % (ref 12.0–46.0)
Lymphs Abs: 2.9 K/uL (ref 0.7–4.0)
MCHC: 34.2 g/dL (ref 30.0–36.0)
MCV: 89.8 fl (ref 78.0–100.0)
Monocytes Absolute: 1.2 K/uL — ABNORMAL HIGH (ref 0.1–1.0)
Monocytes Relative: 12.4 % — ABNORMAL HIGH (ref 3.0–12.0)
Neutro Abs: 5.1 K/uL (ref 1.4–7.7)
Neutrophils Relative %: 53.8 % (ref 43.0–77.0)
Platelets: 267 K/uL (ref 150.0–400.0)
RBC: 5.1 Mil/uL (ref 4.22–5.81)
RDW: 12.9 % (ref 11.5–15.5)
WBC: 9.5 K/uL (ref 4.0–10.5)

## 2024-01-18 LAB — COMPREHENSIVE METABOLIC PANEL WITH GFR
ALT: 18 U/L (ref 0–53)
AST: 24 U/L (ref 0–37)
Albumin: 4.4 g/dL (ref 3.5–5.2)
Alkaline Phosphatase: 55 U/L (ref 39–117)
BUN: 22 mg/dL (ref 6–23)
CO2: 30 meq/L (ref 19–32)
Calcium: 9.3 mg/dL (ref 8.4–10.5)
Chloride: 105 meq/L (ref 96–112)
Creatinine, Ser: 1.46 mg/dL (ref 0.40–1.50)
GFR: 53.15 mL/min — ABNORMAL LOW (ref >60.00)
Glucose, Bld: 101 mg/dL — ABNORMAL HIGH (ref 70–99)
Potassium: 4.2 meq/L (ref 3.5–5.1)
Sodium: 142 meq/L (ref 135–145)
Total Bilirubin: 0.7 mg/dL (ref 0.2–1.2)
Total Protein: 6.8 g/dL (ref 6.0–8.3)

## 2024-01-18 LAB — URIC ACID: Uric Acid, Serum: 8.2 mg/dL — ABNORMAL HIGH (ref 4.0–7.8)

## 2024-01-25 ENCOUNTER — Encounter: Payer: Self-pay | Admitting: Family Medicine

## 2024-01-25 ENCOUNTER — Ambulatory Visit: Admitting: Family Medicine

## 2024-01-25 VITALS — BP 138/80 | HR 72 | Temp 98.3°F | Ht 73.25 in | Wt 225.5 lb

## 2024-01-25 DIAGNOSIS — R059 Cough, unspecified: Secondary | ICD-10-CM

## 2024-01-25 DIAGNOSIS — E785 Hyperlipidemia, unspecified: Secondary | ICD-10-CM

## 2024-01-25 DIAGNOSIS — M109 Gout, unspecified: Secondary | ICD-10-CM

## 2024-01-25 DIAGNOSIS — I10 Essential (primary) hypertension: Secondary | ICD-10-CM

## 2024-01-25 DIAGNOSIS — G43909 Migraine, unspecified, not intractable, without status migrainosus: Secondary | ICD-10-CM

## 2024-01-25 DIAGNOSIS — L408 Other psoriasis: Secondary | ICD-10-CM

## 2024-01-25 DIAGNOSIS — N529 Male erectile dysfunction, unspecified: Secondary | ICD-10-CM

## 2024-01-25 DIAGNOSIS — Z0000 Encounter for general adult medical examination without abnormal findings: Secondary | ICD-10-CM

## 2024-01-25 DIAGNOSIS — Z7189 Other specified counseling: Secondary | ICD-10-CM

## 2024-01-25 MED ORDER — NIACIN ER 500 MG PO CPCR: 500.0000 mg | ORAL_CAPSULE | Freq: Every day | ORAL | Status: AC

## 2024-01-25 MED ORDER — COLCHICINE 0.6 MG PO TABS: 0.6000 mg | ORAL_TABLET | Freq: Every day | ORAL | 1 refills | Status: AC | PRN

## 2024-01-25 MED ORDER — TADALAFIL 20 MG PO TABS: 10.0000 mg | ORAL_TABLET | ORAL | 3 refills | Status: AC | PRN

## 2024-01-25 MED ORDER — FENOFIBRATE 145 MG PO TABS: ORAL_TABLET | ORAL | 3 refills | Status: AC

## 2024-01-25 MED ORDER — LISINOPRIL 20 MG PO TABS: 20.0000 mg | ORAL_TABLET | Freq: Every day | ORAL | 3 refills | Status: AC

## 2024-01-25 NOTE — Patient Instructions (Addendum)
 Let me know if you need an order for blood work at your job.  Take care.  Glad to see you. Let me know if cialis isn't helpful or tolerated.

## 2024-01-25 NOTE — Progress Notes (Unsigned)
 CPE- See plan.  Routine anticipatory guidance given to patient.  See health maintenance.  The possibility exists that previously documented standard health maintenance information may have been brought forward from a previous encounter into this note.  If needed, that same information has been updated to reflect the current situation based on today's encounter.    Tetanus 2017 Flu shot 2025 Shingles and PNA d/w pt.   covid vaccine prev done.   Colonoscopy 2019 Prostate cancer screening and PSA options (with potential risks and benefits of testing vs not testing) were discussed along with recent recs/guidelines.  He declined testing PSA at this point.  Living will.  Wife designated if patient were incapacitated.   Diet and exercise d/w pt.    ED d/w pt.  Occ noted in the last few years.  D/w pt about trial of sildenafil, he tried med prev from [facility].  His face was flushed with med use.  Discussed that he can potentially try Cialis and see if he tolerates that/has benefit.  He may be able to get blood drawn at work in the future, d/w pt.    Hypertension:    Using medication without problems or lightheadedness: yes Chest pain with exertion: no Edema:no Short of breath:no Labs d/w pt.    Elevated Cholesterol: Using medications without problems: yes Muscle aches: see below.   Diet compliance: d/w pt.   Exercise: d/w pt.    He has some joint pain, unclear if this is psoriatic arthritis.  He is going to f/u with rheum, ie elbow, hip, toe pain.    Rare gout flare.  Using colchicine  prn.    GI sx improved after cholecystectomy.    Cough is minimal, possibly seasonal, ie mild inc in winter with cold weather.   Visual aura noted.  He was charting that, to track frequency.  Frequency improved with job change.  He can have photophobia with events.  I asked him to update me as needed.  PMH and SH reviewed  Meds, vitals, and allergies reviewed.   ROS: Per HPI.  Unless specifically indicated  otherwise in HPI, the patient denies:  General: fever. Eyes: acute vision changes ENT: sore throat Cardiovascular: chest pain Respiratory: SOB GI: vomiting GU: dysuria Musculoskeletal: acute back pain Derm: acute rash Neuro: acute motor dysfunction Psych: worsening mood Endocrine: polydipsia Heme: bleeding Allergy: hayfever  GEN: nad, alert and oriented HEENT: mucous membranes moist NECK: supple w/o LA CV: rrr. 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

## 2024-01-27 DIAGNOSIS — N529 Male erectile dysfunction, unspecified: Secondary | ICD-10-CM | POA: Insufficient documentation

## 2024-01-27 NOTE — Assessment & Plan Note (Signed)
 Cough is minimal, possibly seasonal, ie mild inc in winter with cold weather.  Lungs are clear.  I asked him to update me as needed.

## 2024-01-27 NOTE — Assessment & Plan Note (Signed)
 Continue topical treatment. He has some joint pain, unclear if this is psoriatic arthritis.  He is going to f/u with rheum, ie elbow, hip, toe pain.

## 2024-01-27 NOTE — Assessment & Plan Note (Signed)
 Continue colchicine  as needed.  Update me as needed.

## 2024-01-27 NOTE — Assessment & Plan Note (Signed)
 With history of aura, frequency improved with job change.  He can update me as needed.

## 2024-01-27 NOTE — Assessment & Plan Note (Signed)
Continue work on diet and exercise.  Continue lisinopril.

## 2024-01-27 NOTE — Assessment & Plan Note (Signed)
 D/w pt about trial of sildenafil, he tried med prev from [facility].  His face was flushed with med use.  Discussed that he can potentially try Cialis and see if he tolerates that/has benefit.

## 2024-01-27 NOTE — Assessment & Plan Note (Signed)
Living will.  Wife designated if patient were incapacitated.   ?

## 2024-01-27 NOTE — Assessment & Plan Note (Signed)
 Continue work on diet and exercise.  Continue niacin  and fenofibrate .

## 2024-01-27 NOTE — Assessment & Plan Note (Signed)
 Tetanus 2017 Flu shot 2025 Shingles and PNA d/w pt.   covid vaccine prev done.   Colonoscopy 2019 Prostate cancer screening and PSA options (with potential risks and benefits of testing vs not testing) were discussed along with recent recs/guidelines.  He declined testing PSA at this point.  Living will.  Wife designated if patient were incapacitated.   Diet and exercise d/w pt.

## 2024-04-12 ENCOUNTER — Ambulatory Visit: Admitting: Internal Medicine

## 2025-01-18 ENCOUNTER — Other Ambulatory Visit

## 2025-01-26 ENCOUNTER — Encounter: Admitting: Family Medicine
